# Patient Record
Sex: Female | Born: 1960 | Race: White | Hispanic: No | Smoking: Never smoker
Health system: Southern US, Community
[De-identification: ages and names within clinical notes are randomized; demographics above are authoritative.]

## PROBLEM LIST (undated history)

## (undated) ENCOUNTER — Ambulatory Visit (HOSPITAL_BASED_OUTPATIENT_CLINIC_OR_DEPARTMENT_OTHER): Admission: EM | Source: Home / Self Care

## (undated) DIAGNOSIS — G5603 Carpal tunnel syndrome, bilateral upper limbs: Secondary | ICD-10-CM

## (undated) DIAGNOSIS — F329 Major depressive disorder, single episode, unspecified: Secondary | ICD-10-CM

## (undated) DIAGNOSIS — G4730 Sleep apnea, unspecified: Secondary | ICD-10-CM

## (undated) DIAGNOSIS — I10 Essential (primary) hypertension: Secondary | ICD-10-CM

## (undated) DIAGNOSIS — M1990 Unspecified osteoarthritis, unspecified site: Secondary | ICD-10-CM

## (undated) DIAGNOSIS — G4733 Obstructive sleep apnea (adult) (pediatric): Secondary | ICD-10-CM

## (undated) DIAGNOSIS — N390 Urinary tract infection, site not specified: Secondary | ICD-10-CM

## (undated) DIAGNOSIS — J45909 Unspecified asthma, uncomplicated: Secondary | ICD-10-CM

## (undated) DIAGNOSIS — T7840XA Allergy, unspecified, initial encounter: Secondary | ICD-10-CM

## (undated) DIAGNOSIS — K529 Noninfective gastroenteritis and colitis, unspecified: Secondary | ICD-10-CM

## (undated) DIAGNOSIS — F32A Depression, unspecified: Secondary | ICD-10-CM

## (undated) DIAGNOSIS — M549 Dorsalgia, unspecified: Secondary | ICD-10-CM

## (undated) DIAGNOSIS — N189 Chronic kidney disease, unspecified: Secondary | ICD-10-CM

## (undated) HISTORY — PX: CERVICAL FUSION: SHX112

## (undated) HISTORY — DX: Sleep apnea, unspecified: G47.30

## (undated) HISTORY — DX: Dorsalgia, unspecified: M54.9

## (undated) HISTORY — DX: Noninfective gastroenteritis and colitis, unspecified: K52.9

## (undated) HISTORY — DX: Chronic kidney disease, unspecified: N18.9

## (undated) HISTORY — DX: Unspecified asthma, uncomplicated: J45.909

## (undated) HISTORY — DX: Obstructive sleep apnea (adult) (pediatric): G47.33

## (undated) HISTORY — DX: Allergy, unspecified, initial encounter: T78.40XA

---

## 2003-03-26 HISTORY — PX: CHOLECYSTECTOMY, LAPAROSCOPIC: SHX56

## 2009-02-27 ENCOUNTER — Emergency Department (HOSPITAL_COMMUNITY): Admission: EM | Admit: 2009-02-27 | Discharge: 2009-02-27 | Payer: Self-pay | Admitting: Psychiatry

## 2010-01-16 ENCOUNTER — Emergency Department (HOSPITAL_COMMUNITY): Admission: EM | Admit: 2010-01-16 | Discharge: 2010-01-16 | Payer: Self-pay | Admitting: Emergency Medicine

## 2010-06-06 LAB — POCT I-STAT, CHEM 8
BUN: 14 mg/dL (ref 6–23)
Sodium: 139 mEq/L (ref 135–145)
TCO2: 23 mmol/L (ref 0–100)

## 2010-06-06 LAB — URINALYSIS, ROUTINE W REFLEX MICROSCOPIC
Glucose, UA: NEGATIVE mg/dL
Protein, ur: NEGATIVE mg/dL
Specific Gravity, Urine: 1.024 (ref 1.005–1.030)
pH: 6 (ref 5.0–8.0)

## 2011-09-16 ENCOUNTER — Emergency Department (HOSPITAL_COMMUNITY)
Admission: EM | Admit: 2011-09-16 | Discharge: 2011-09-16 | Disposition: A | Discharge status: Home / Self Care | Payer: Self-pay | Attending: Emergency Medicine | Admitting: Emergency Medicine

## 2011-09-16 ENCOUNTER — Encounter (HOSPITAL_COMMUNITY): Payer: Self-pay | Admitting: Emergency Medicine

## 2011-09-16 DIAGNOSIS — I10 Essential (primary) hypertension: Secondary | ICD-10-CM | POA: Insufficient documentation

## 2011-09-16 DIAGNOSIS — F3289 Other specified depressive episodes: Secondary | ICD-10-CM | POA: Insufficient documentation

## 2011-09-16 DIAGNOSIS — R109 Unspecified abdominal pain: Secondary | ICD-10-CM | POA: Insufficient documentation

## 2011-09-16 DIAGNOSIS — F329 Major depressive disorder, single episode, unspecified: Secondary | ICD-10-CM | POA: Insufficient documentation

## 2011-09-16 HISTORY — DX: Carpal tunnel syndrome, bilateral upper limbs: G56.03

## 2011-09-16 HISTORY — DX: Essential (primary) hypertension: I10

## 2011-09-16 HISTORY — DX: Major depressive disorder, single episode, unspecified: F32.9

## 2011-09-16 HISTORY — DX: Depression, unspecified: F32.A

## 2011-09-16 HISTORY — DX: Urinary tract infection, site not specified: N39.0

## 2011-09-16 LAB — POCT I-STAT, CHEM 8
BUN: 15 mg/dL (ref 6–23)
Chloride: 105 mEq/L (ref 96–112)
Creatinine, Ser: 0.7 mg/dL (ref 0.50–1.10)
Glucose, Bld: 112 mg/dL — ABNORMAL HIGH (ref 70–99)
Potassium: 3.9 mEq/L (ref 3.5–5.1)

## 2011-09-16 LAB — URINALYSIS, ROUTINE W REFLEX MICROSCOPIC
Glucose, UA: NEGATIVE mg/dL
Leukocytes, UA: NEGATIVE
Protein, ur: NEGATIVE mg/dL
Urobilinogen, UA: 0.2 mg/dL (ref 0.0–1.0)

## 2011-09-16 MED ORDER — NAPROXEN 500 MG PO TABS: 500.0000 mg | ORAL_TABLET | Freq: Two times a day (BID) | ORAL | Status: DC

## 2011-09-16 MED ORDER — HYDROCHLOROTHIAZIDE 25 MG PO TABS: 25.0000 mg | ORAL_TABLET | Freq: Every day | ORAL | Status: DC

## 2011-09-16 NOTE — Discharge Instructions (Signed)
Flank Pain Flank pain refers to pain that is located on the side of the body between the upper abdomen and the back. It can be caused by many things. CAUSES  Some of the more common causes of flank pain include:  Muscle strain.   Muscle spasms.   A disease of your spine (vertebral disk disease).   A lung infection (pneumonia).   Fluid around your lungs (pulmonary edema).   A kidney infection.   Kidney stones.   A very painful skin rash on only one side of your body (shingles).   Gallbladder disease.  DIAGNOSIS  Blood tests, urine tests, and X-rays may help your caregiver determine what is wrong. TREATMENT  The treatment of pain depends on the cause. Your caregiver will determine what treatment will work best for you. HOME CARE INSTRUCTIONS   Home care will depend on the cause of your pain.   Some medications may help relieve the pain. Take medication for relief of pain as directed by your caregiver.   Tell your caregiver about any changes in your pain.   Follow up with your caregiver.  SEEK IMMEDIATE MEDICAL CARE IF:   Your pain is not controlled with medication.   The pain increases.   You have abdominal pain.   You have shortness of breath.   You have persistent nausea or vomiting.   You have swelling in your abdomen.   You feel faint or pass out.   You have a temperature by mouth above 102 F (38.9 C), not controlled by medicine.  MAKE SURE YOU:   Understand these instructions.   Will watch your condition.   Will get help right away if you are not doing well or get worse.  Document Released: 05/02/2005 Document Revised: 02/28/2011 Document Reviewed: 08/26/2009 [employer] Patient Information 2012 [employer]. 

## 2011-09-16 NOTE — ED Notes (Signed)
Pt requested for lights to be turned off.

## 2011-09-16 NOTE — ED Notes (Signed)
Pt stated that she has a hx of UTI. The pt took Amoxicillin in past week from friend. Pt stated that pain returned 3 days ago. No fever nausea and vomiting.

## 2011-09-16 NOTE — ED Provider Notes (Signed)
History     CSN: [id number] Arrival date & time 09/16/11  1008 First MD Initiated Contact with Patient 09/16/11 1024      Chief Complaint  Patient presents with  . Flank Pain   HPI The patient states she's been having trouble with flank pain for the last 2 months. She has history of having urinary tract infections in the past and these symptoms feel similar to that.  She has not had any trouble with fever nausea or vomiting. She has not had any abdominal pain. The pain is in both flanks and sometimes palpation can affect the discomfort. Patient states she took amoxicillin that a friend had for 5 days. The symptoms improved somewhat after that but then returned again 3 days ago. Patient doesn't have a primary care Dr. so she came to the emergency room.  Patient also requests a refill on her home medications while she is here today.  Past Medical History  Diagnosis Date  . UTI (lower urinary tract infection)   . Hypertension   . Depression   . Carpal tunnel syndrome, bilateral     Past Surgical History  Procedure Date  . Cesarean section   . Cholecystectomy, laparoscopic 2005    No family history on file.  History  Substance Use Topics  . Smoking status: Never Smoker   . Smokeless tobacco: Not on file  . Alcohol Use: No    OB History    Grav Para Term Preterm Abortions TAB SAB Ect Mult Living                  Review of Systems  Constitutional: Negative for fever.  Respiratory: Negative for cough.   Gastrointestinal: Negative for abdominal pain.  All other systems reviewed and are negative.    Allergies  Macrodantin and Morphine and related  Home Medications  No current outpatient prescriptions on file.  BP 127/88  Pulse 67  Temp 98.2 F (36.8 C) (Oral)  Resp 16  SpO2 99%  LMP 12/17/2010  Physical Exam  Nursing note and vitals reviewed. Constitutional: She appears well-developed and well-nourished. No distress.  HENT:  Head: Normocephalic and  atraumatic.  Right Ear: External ear normal.  Left Ear: External ear normal.  Eyes: Conjunctivae are normal. Right eye exhibits no discharge. Left eye exhibits no discharge. No scleral icterus.  Neck: Neck supple. No tracheal deviation present.  Cardiovascular: Normal rate, regular rhythm and intact distal pulses.   Pulmonary/Chest: Effort normal and breath sounds normal. No stridor. No respiratory distress. She has no wheezes. She has no rales.  Abdominal: Soft. Bowel sounds are normal. She exhibits no distension. There is no tenderness. There is CVA tenderness (bilateral flank pain). There is no rebound and no guarding.  Musculoskeletal: She exhibits no edema and no tenderness.  Neurological: She is alert. She has normal strength. No sensory deficit. Cranial nerve deficit:  no gross defecits noted. She exhibits normal muscle tone. She displays no seizure activity. Coordination normal.  Skin: Skin is warm and dry. No rash noted.  Psychiatric: She has a normal mood and affect.    ED Course  Procedures (including critical care time)  Labs Reviewed  POCT I-STAT, CHEM 8 - Abnormal; Notable for the following:    Glucose, Bld 112 (*)     All other components within normal limits  URINALYSIS, ROUTINE W REFLEX MICROSCOPIC  URINE CULTURE   No results found.   1. Flank pain       MDM  Patient  is concerned that she had a urinary tract infection. Her urine at this time is normal. We'll send off a culture however for further analysis considering her concerns. I have discussed the findings with the patient. Recommend followup with primary care Dr. if the symptoms persist I will prescribe her course of nonsteroidal anti-inflammatory agents. Patient did request a refill of her blood pressure medications and I provided for her to the emergency department the  At this time there does not appear to be any evidence of an acute emergency medical condition and the patient appears stable for discharge  with appropriate outpatient follow up.        Celene Kras, MD 09/16/11 1131

## 2011-09-17 LAB — URINE CULTURE: Colony Count: 2000

## 2011-09-21 ENCOUNTER — Emergency Department (HOSPITAL_COMMUNITY)
Admission: EM | Admit: 2011-09-21 | Discharge: 2011-09-21 | Disposition: A | Discharge status: Home / Self Care | Payer: Self-pay | Attending: Emergency Medicine | Admitting: Emergency Medicine

## 2011-09-21 ENCOUNTER — Emergency Department (HOSPITAL_COMMUNITY): Payer: Self-pay

## 2011-09-21 ENCOUNTER — Encounter (HOSPITAL_COMMUNITY): Payer: Self-pay | Admitting: *Deleted

## 2011-09-21 DIAGNOSIS — R109 Unspecified abdominal pain: Secondary | ICD-10-CM | POA: Insufficient documentation

## 2011-09-21 DIAGNOSIS — F3289 Other specified depressive episodes: Secondary | ICD-10-CM | POA: Insufficient documentation

## 2011-09-21 DIAGNOSIS — I10 Essential (primary) hypertension: Secondary | ICD-10-CM | POA: Insufficient documentation

## 2011-09-21 DIAGNOSIS — N390 Urinary tract infection, site not specified: Secondary | ICD-10-CM

## 2011-09-21 DIAGNOSIS — Z79899 Other long term (current) drug therapy: Secondary | ICD-10-CM | POA: Insufficient documentation

## 2011-09-21 DIAGNOSIS — R300 Dysuria: Secondary | ICD-10-CM | POA: Insufficient documentation

## 2011-09-21 DIAGNOSIS — R51 Headache: Secondary | ICD-10-CM | POA: Insufficient documentation

## 2011-09-21 DIAGNOSIS — F329 Major depressive disorder, single episode, unspecified: Secondary | ICD-10-CM | POA: Insufficient documentation

## 2011-09-21 LAB — URINALYSIS, ROUTINE W REFLEX MICROSCOPIC
Bilirubin Urine: NEGATIVE
Glucose, UA: NEGATIVE mg/dL
Hgb urine dipstick: NEGATIVE
Specific Gravity, Urine: 1.032 — ABNORMAL HIGH (ref 1.005–1.030)
pH: 5.5 (ref 5.0–8.0)

## 2011-09-21 LAB — URINE MICROSCOPIC-ADD ON

## 2011-09-21 MED ORDER — CIPROFLOXACIN HCL 500 MG PO TABS
500.0000 mg | ORAL_TABLET | Freq: Two times a day (BID) | ORAL | Status: DC
  Administered 2011-09-21: 500 mg via ORAL
  Filled 2011-09-21: qty 1

## 2011-09-21 MED ORDER — KETOROLAC TROMETHAMINE 60 MG/2ML IM SOLN
60.0000 mg | Freq: Once | INTRAMUSCULAR | Status: AC
  Administered 2011-09-21: 60 mg via INTRAMUSCULAR
  Filled 2011-09-21: qty 2

## 2011-09-21 MED ORDER — CIPROFLOXACIN HCL 250 MG PO TABS: 250.0000 mg | ORAL_TABLET | Freq: Two times a day (BID) | ORAL | Status: AC

## 2011-09-21 NOTE — ED Notes (Signed)
Pt from home with reports of left flank pain, burning with urination, headache and left groin pain that began 6 weeks ago but has worsened since Monday. Pt reports being treated here on Monday for same with no improvement. Pt endorses taking Amoxicillin 500mg  twice daily that she got from a friend with minimal improvement.

## 2011-09-21 NOTE — ED Provider Notes (Signed)
History     CSN: [id number]  Arrival date & time 09/21/11  1191   First MD Initiated Contact with Patient 09/21/11 1014      Chief Complaint  Patient presents with  . Flank Pain    left  . Groin Pain    left  . Dysuria  . Headache    (Consider location/radiation/quality/duration/timing/severity/associated sxs/prior treatment) HPI Comments: Patient with a history of urinary tract infections presents emergency department with chief complaint of left-sided flank pain associated with urinary frequency and nausea.  Patient states the flank pain is more of an 8 and colicky type feeling that intermittent in nature and with onset x6 weeks.  Patient self diagnosed herself with a urinary tract infection and took amoxicillin 500 mg twice a day x5 days.  She was seen in this emergency department while on her antibiotic and her urine at that time was clean and the culture did not grow anything out.  Patient believes that she still has a urinary tract infection.  She stopped taking her antibiotic last Thursday is still symptomatic.  Patient denies any fevers, night sweats, chills, vomiting, abdominal pain, abnormal dc, SOB, CP. No other complaints at this time.   Patient is a 51 y.o. female presenting with flank pain, groin pain, dysuria, and headaches. The history is provided by the patient.  Flank Pain Pertinent negatives include no abdominal pain, chest pain, chills, congestion, fever, headaches, numbness or weakness.  Groin Pain Pertinent negatives include no abdominal pain, chest pain, chills, congestion, fever, headaches, numbness or weakness.  Dysuria  Associated symptoms include flank pain. Pertinent negatives include no chills, no frequency and no urgency.  Headache  Pertinent negatives include no fever and no shortness of breath.    Past Medical History  Diagnosis Date  . UTI (lower urinary tract infection)   . Hypertension   . Depression   . Carpal tunnel syndrome, bilateral      Past Surgical History  Procedure Date  . Cesarean section   . Cholecystectomy, laparoscopic 2005    History reviewed. No pertinent family history.  History  Substance Use Topics  . Smoking status: Never Smoker   . Smokeless tobacco: Never Used  . Alcohol Use: No    OB History    Grav Para Term Preterm Abortions TAB SAB Ect Mult Living                  Review of Systems  Constitutional: Negative for fever, chills and appetite change.  HENT: Negative for congestion.   Eyes: Negative for visual disturbance.  Respiratory: Negative for shortness of breath.   Cardiovascular: Negative for chest pain and leg swelling.  Gastrointestinal: Negative for abdominal pain.  Genitourinary: Positive for dysuria and flank pain. Negative for urgency and frequency.  Neurological: Negative for dizziness, syncope, weakness, light-headedness, numbness and headaches.  Psychiatric/Behavioral: Negative for confusion.  All other systems reviewed and are negative.    Allergies  Macrodantin; Morphine and related; and Sudafed  Home Medications   Current Outpatient Rx  Name Route Sig Dispense Refill  . ASPIRIN BUFFERED 325 MG PO TABS Oral Take 325 mg by mouth daily.    Marland Kitchen CITALOPRAM HYDROBROMIDE 20 MG PO TABS Oral Take 20 mg by mouth daily.    Marland Kitchen HYDROCHLOROTHIAZIDE 25 MG PO TABS Oral Take 1 tablet (25 mg total) by mouth daily. 30 tablet 1  . ONE-A-DAY WOMENS PO Oral Take 1 tablet by mouth daily.    . OMEGA-3-ACID ETHYL ESTERS 1  G PO CAPS Oral Take 3 g by mouth 2 (two) times daily.    Marland Kitchen ZINC 30 MG PO CAPS Oral Take 1 capsule by mouth daily.      BP 127/79  Pulse 62  Temp 99.5 F (37.5 C) (Oral)  Resp 18  Wt 160 lb (72.576 kg)  SpO2 99%  LMP 12/17/2010  Physical Exam  Constitutional: She is oriented to person, place, and time. She appears well-developed and well-nourished.       NAD, Normal vitals  HENT:  Head: Normocephalic and atraumatic.  Mouth/Throat: Oropharynx is clear and  moist. No oropharyngeal exudate.  Eyes: Conjunctivae and EOM are normal. Pupils are equal, round, and reactive to light. No scleral icterus.  Neck: Normal range of motion. Neck supple. No tracheal deviation present. No thyromegaly present.  Cardiovascular: Normal rate, regular rhythm, normal heart sounds and intact distal pulses.   Pulmonary/Chest: Effort normal and breath sounds normal. No stridor. No respiratory distress. She has no wheezes.  Abdominal: Soft.       Soft nontender abdomen bowel sounds present.    Musculoskeletal: Normal range of motion. She exhibits no edema and no tenderness.  Neurological: She is alert and oriented to person, place, and time. Coordination normal.  Skin: Skin is warm and dry. No rash noted. No erythema. No pallor.  Psychiatric: She has a normal mood and affect. Her behavior is normal.    ED Course  Procedures (including critical care time)  Labs Reviewed  URINALYSIS, ROUTINE W REFLEX MICROSCOPIC - Abnormal; Notable for the following:    APPearance TURBID (*)     Specific Gravity, Urine 1.032 (*)     All other components within normal limits  URINE MICROSCOPIC-ADD ON - Abnormal; Notable for the following:    Squamous Epithelial / LPF FEW (*)     Bacteria, UA FEW (*)     Crystals CA OXALATE CRYSTALS (*)     All other components within normal limits  URINE CULTURE   Ct Abdomen Pelvis Wo Contrast  09/21/2011  *RADIOLOGY REPORT*  Clinical Data: Flank pain  CT ABDOMEN AND PELVIS WITHOUT CONTRAST  Technique:  Multidetector CT imaging of the abdomen and pelvis was performed following the standard protocol without intravenous contrast.  Comparison: None  Findings: Lung bases appear clear.  No pericardial or pleural effusion.  Normal appearance of the liver.  Prior cholecystectomy. No biliary dilatation. The pancreas is within normal limits.  Normal appearance of the spleen.  Both adrenal glands are normal.  The left kidney is normal.  The right kidney is  normal.  No hydronephrosis or nephrolithiasis.  The urinary bladder is within normal limits.  The uterus and adnexal structures are unremarkable.  There is no adenopathy within the upper abdomen.  No enlarged pelvic or inguinal lymph nodes.  There is no free fluid or fluid collections within the abdomen or pelvis.  The duodenum and stomach appear within normal limits.  The small bowel loops are negative.  The appendix is identified and appears normal.  The colon is negative.  No obstructing colonic mass noted.  Mild degenerative disc disease noted within the lower thoracic and upper lumbar spine.  IMPRESSION:  1.  No acute findings identified. 2.  Prior cholecystectomy.  Original Report Authenticated By: Rosealee Albee, M.D.     No diagnosis found.    MDM  Likely UTI  Pt with mucous in UA microscopic. No bacteria or WBC likely d/t previous abx use. Will be treated with  Cipro 250BID x 7 days. Advised to use naprosyn for pain mngt prescribed by previous provider. At this time there does not appear to be any evidence of an acute emergency medical condition and the patient appears stable for discharge with appropriate outpatient follow up.  Jaci Carrel, PA-C 09/21/11 1236

## 2011-09-21 NOTE — ED Provider Notes (Signed)
Medical screening examination/treatment/procedure(s) were performed by non-physician practitioner and as supervising physician I was immediately available for consultation/collaboration.   Gwyneth Sprout, MD 09/21/11 [phone]

## 2011-09-21 NOTE — Discharge Instructions (Signed)
.  Urine Culture Collection, Female  You will collect a sample of pee (urine) in a cup. Read the instructions below before beginning. If you have any questions, ask the nurse before you begin. Follow the instructions carefully. 1. Wash your hands with soap and water and dry them thoroughly.  2. Open the lid of the cup. Be careful not to touch the inside.  3. Clean the private (genital) area. 1. Sit over the toilet. Use the fingers of one hand to separate and hold open the folds of the skin in your private area.  2. Clean the pee (urinary) opening and surrounding area with the gauze, wiping from front to back. Throw away the gauze in the trash, not the toilet.  3. Repeat step "b"2 more times.  4. With the folds of skin still separated, pee a small amount into toilet. STOP, then pee into the cup. Fill the cup half way.  5. Put the lid on the cup tightly.  6. Wash your hands with soap and water.  7. If you were given a label, put the label on the cup.  8. Give the cup to the nurse.  Document Released: 02/22/2008 Document Revised: 02/28/2011 Document Reviewed: 02/22/2008 [facility] Patient Information 2012 [place], [place].

## 2011-09-23 LAB — URINE CULTURE: Special Requests: NORMAL

## 2012-03-10 ENCOUNTER — Ambulatory Visit: Payer: Self-pay | Admitting: Family Medicine

## 2012-03-10 VITALS — BP 123/77 | HR 99 | Temp 99.3°F | Resp 16 | Ht 60.0 in | Wt 159.0 lb

## 2012-03-10 DIAGNOSIS — F329 Major depressive disorder, single episode, unspecified: Secondary | ICD-10-CM

## 2012-03-10 DIAGNOSIS — I10 Essential (primary) hypertension: Secondary | ICD-10-CM

## 2012-03-10 DIAGNOSIS — F3289 Other specified depressive episodes: Secondary | ICD-10-CM

## 2012-03-10 DIAGNOSIS — R350 Frequency of micturition: Secondary | ICD-10-CM

## 2012-03-10 DIAGNOSIS — R109 Unspecified abdominal pain: Secondary | ICD-10-CM

## 2012-03-10 DIAGNOSIS — F32A Depression, unspecified: Secondary | ICD-10-CM

## 2012-03-10 LAB — POCT CBC
MCH, POC: 29.1 pg (ref 27–31.2)
MCHC: 30.9 g/dL — AB (ref 31.8–35.4)
MCV: 94 fL (ref 80–97)
MID (cbc): 0.7 (ref 0–0.9)
MPV: 7.9 fL (ref 0–99.8)
POC LYMPH PERCENT: 28.4 %L (ref 10–50)
POC MID %: 6.6 %M (ref 0–12)
Platelet Count, POC: 303 10*3/uL (ref 142–424)
RBC: 4 M/uL — AB (ref 4.04–5.48)
RDW, POC: 13.4 %
WBC: 10.1 10*3/uL (ref 4.6–10.2)

## 2012-03-10 LAB — BASIC METABOLIC PANEL
BUN: 13 mg/dL (ref 6–23)
Calcium: 9.6 mg/dL (ref 8.4–10.5)
Chloride: 103 mEq/L (ref 96–112)
Creat: 0.75 mg/dL (ref 0.50–1.10)

## 2012-03-10 LAB — POCT URINALYSIS DIPSTICK
Bilirubin, UA: NEGATIVE
Blood, UA: NEGATIVE
Glucose, UA: NEGATIVE
Ketones, UA: NEGATIVE
Leukocytes, UA: NEGATIVE
Nitrite, UA: NEGATIVE
Protein, UA: NEGATIVE
Spec Grav, UA: 1.015
Urobilinogen, UA: 0.2
pH, UA: 7.5

## 2012-03-10 LAB — POCT UA - MICROSCOPIC ONLY
Casts, Ur, LPF, POC: NEGATIVE
Crystals, Ur, HPF, POC: NEGATIVE
Mucus, UA: NEGATIVE
Yeast, UA: NEGATIVE

## 2012-03-10 MED ORDER — CITALOPRAM HYDROBROMIDE 20 MG PO TABS: 20.0000 mg | ORAL_TABLET | Freq: Every day | ORAL | Status: DC

## 2012-03-10 MED ORDER — HYDROCHLOROTHIAZIDE 25 MG PO TABS: 25.0000 mg | ORAL_TABLET | Freq: Every day | ORAL | Status: DC

## 2012-03-10 MED ORDER — CEPHALEXIN 500 MG PO CAPS: 500.0000 mg | ORAL_CAPSULE | Freq: Three times a day (TID) | ORAL | Status: DC

## 2012-03-10 NOTE — Patient Instructions (Addendum)
Drink plenty of water.  I will let you know when you labs come in- we did a urine culture to ensure that you do have an infection and that we are treating you with the correct medication.    If you are running a fever or feeling worse please let me know right away!

## 2012-03-10 NOTE — Progress Notes (Signed)
[facility] [address] [place] [address] [phone]  Date:  03/10/2012   Name:  Kari [name]   DOB:  July 08, 1960   MRN:  [id number]  PCP:  No primary provider on file.    Chief Complaint: Urinary Frequency   History of Present Illness:  Kari [name] is a 51 y.o. very pleasant female patient who presents with the following:  She is here today with some LBP more on the left- she thought she might have a UTI.  She does tend to have UTIs on occasion.  She has had a few UTIs over the last several months. The pain does not radiate to her legs and she does not have any numbness or weakness.     She has noted some chills and has felt tired, but no fever.  She has not noted any particular urinary symptoms.    She notes a mild scratchy throat and mild diarrhea which she attributes to drinking coconut water.   She also uses HCTZ for her BP and celexa for depression- the celexa works well, and she has been on this for several years.she needs a RF today if possible.  She has not had a period in several months and believes she is in menopause.    There is no problem list on file for this patient.   Past Medical History  Diagnosis Date  . UTI (lower urinary tract infection)   . Hypertension   . Depression   . Carpal tunnel syndrome, bilateral     Past Surgical History  Procedure Date  . Cesarean section   . Cholecystectomy, laparoscopic 2005    History  Substance Use Topics  . Smoking status: Never Smoker   . Smokeless tobacco: Never Used  . Alcohol Use: No    No family history on file.  Allergies  Allergen Reactions  . Macrodantin (Nitrofurantoin Macrocrystal) Itching  . Morphine And Related Itching  . Sudafed (Pseudoephedrine Hcl)     Blood pressure goes up    Medication list has been reviewed and updated.  Current Outpatient Prescriptions on File Prior to Visit  Medication Sig Dispense Refill  . citalopram (CELEXA) 20 MG  tablet Take 20 mg by mouth daily.      . hydrochlorothiazide (HYDRODIURIL) 25 MG tablet Take 1 tablet (25 mg total) by mouth daily.  30 tablet  1  . Multiple Vitamins-Calcium (ONE-A-DAY WOMENS PO) Take 1 tablet by mouth daily.      . Zinc 30 MG CAPS Take 1 capsule by mouth daily.      Marland Kitchen aspirin 325 MG buffered tablet Take 325 mg by mouth daily.      Marland Kitchen omega-3 acid ethyl esters (LOVAZA) 1 G capsule Take 3 g by mouth 2 (two) times daily.        Review of Systems:  As per HPI- otherwise negative.   Physical Examination: Filed Vitals:   03/10/12 1241  BP: 123/77  Pulse: 99  Temp: 99.3 F (37.4 C)  Resp: 16   Filed Vitals:   03/10/12 1241  Height: 5' (1.524 m)  Weight: 159 lb (72.122 kg)   Body mass index is 31.05 kg/(m^2). Ideal Body Weight: Weight in (lb) to have BMI = 25: 127.7   GEN: WDWN, NAD, Non-toxic, A & O x 3, overweight HEENT: Atraumatic, Normocephalic. Neck supple. No masses, No LAD. Ears and Nose: No external deformity. CV: RRR, No M/G/R. No JVD. No thrill. No extra heart sounds. PULM: CTA  B, no wheezes, crackles, rhonchi. No retractions. No resp. distress. No accessory muscle use. ABD: S, ND, +BS. No rebound. No HSM. Generalized mild TTP over belly Mild tenderness over bilateral lower back, left more than right.  Negative SLR bilaterally, normal LE strength, sensation and DTR EXTR: No c/c/e NEURO Normal gait.  PSYCH: Normally interactive. Conversant. Not depressed or anxious appearing.  Calm demeanor.   Results for orders placed in visit on 03/10/12  POCT URINALYSIS DIPSTICK      Component Value Range   Color, UA yellow     Clarity, UA clear     Glucose, UA neg     Bilirubin, UA neg     Ketones, UA neg     Spec Grav, UA 1.015     Blood, UA neg     pH, UA 7.5     Protein, UA neg     Urobilinogen, UA 0.2     Nitrite, UA neg     Leukocytes, UA Negative    POCT UA - MICROSCOPIC ONLY      Component Value Range   WBC, Ur, HPF, POC 5-10     RBC, urine,  microscopic 0-1     Bacteria, U Microscopic 1+     Mucus, UA neg     Epithelial cells, urine per micros 2-4     Crystals, Ur, HPF, POC neg     Casts, Ur, LPF, POC neg     Yeast, UA neg    POCT CBC      Component Value Range   WBC 10.1  4.6 - 10.2 K/uL   Lymph, poc 2.9  0.6 - 3.4   POC LYMPH PERCENT 28.4  10 - 50 %L   MID (cbc) 0.7  0 - 0.9   POC MID % 6.6  0 - 12 %M   POC Granulocyte 6.6  2 - 6.9   Granulocyte percent 65.0  37 - 80 %G   RBC 4.00 (*) 4.04 - 5.48 M/uL   Hemoglobin 14.2  12.2 - 16.2 g/dL   HCT, POC 43.5  68.6 - 47.9 %   MCV 94.0  80 - 97 fL   MCH, POC 29.1  27 - 31.2 pg   MCHC 30.9 (*) 31.8 - 35.4 g/dL   RDW, POC 16.8     Platelet Count, POC 303  142 - 424 K/uL   MPV 7.9  0 - 99.8 fL     Assessment and Plan: 1. Urinary frequency  POCT urinalysis dipstick, POCT UA - Microscopic Only, POCT CBC, Basic metabolic panel, Urine culture, cephALEXin (KEFLEX) 500 MG capsule  2. Right flank pain  POCT urinalysis dipstick, POCT UA - Microscopic Only  3. HTN (hypertension)  Basic metabolic panel, hydrochlorothiazide (HYDRODIURIL) 25 MG tablet  4. Depression  citalopram (CELEXA) 20 MG tablet   Refilled her HCTZ and celexa today.  Treat for possible UTI with keflex, await culture.  She will push fluids, let me know if getting worse.  Alerted her that her wbc count is borderline- let me know if she has a fever or other worsening symptoms  Refilled HCTZ- BP controlled Refilled celexa- stable depression  Meds ordered this encounter  Medications  . citalopram (CELEXA) 20 MG tablet    Sig: Take 1 tablet (20 mg total) by mouth daily.    Dispense:  90 tablet    Refill:  2  . DISCONTD: hydrochlorothiazide (HYDRODIURIL) 25 MG tablet    Sig: Take 1 tablet (25 mg total)  by mouth daily.    Dispense:  90 tablet    Refill:  2  . cephALEXin (KEFLEX) 500 MG capsule    Sig: Take 1 capsule (500 mg total) by mouth 3 (three) times daily.    Dispense:  30 capsule    Refill:  0  .  hydrochlorothiazide (HYDRODIURIL) 25 MG tablet    Sig: Take 1 tablet (25 mg total) by mouth daily.    Dispense:  90 tablet    Refill:  2     COPLAND,JESSICA, MD

## 2012-03-12 LAB — URINE CULTURE: Colony Count: 5000

## 2012-08-30 ENCOUNTER — Emergency Department (HOSPITAL_COMMUNITY): Payer: Self-pay

## 2012-08-30 ENCOUNTER — Emergency Department (HOSPITAL_COMMUNITY)
Admission: EM | Admit: 2012-08-30 | Discharge: 2012-08-30 | Disposition: A | Discharge status: Home / Self Care | Payer: Self-pay | Attending: Emergency Medicine | Admitting: Emergency Medicine

## 2012-08-30 ENCOUNTER — Encounter (HOSPITAL_COMMUNITY): Payer: Self-pay

## 2012-08-30 DIAGNOSIS — Z87442 Personal history of urinary calculi: Secondary | ICD-10-CM | POA: Insufficient documentation

## 2012-08-30 DIAGNOSIS — F329 Major depressive disorder, single episode, unspecified: Secondary | ICD-10-CM | POA: Insufficient documentation

## 2012-08-30 DIAGNOSIS — Z8669 Personal history of other diseases of the nervous system and sense organs: Secondary | ICD-10-CM | POA: Insufficient documentation

## 2012-08-30 DIAGNOSIS — I10 Essential (primary) hypertension: Secondary | ICD-10-CM | POA: Insufficient documentation

## 2012-08-30 DIAGNOSIS — Z7982 Long term (current) use of aspirin: Secondary | ICD-10-CM | POA: Insufficient documentation

## 2012-08-30 DIAGNOSIS — Z87440 Personal history of urinary (tract) infections: Secondary | ICD-10-CM | POA: Insufficient documentation

## 2012-08-30 DIAGNOSIS — F3289 Other specified depressive episodes: Secondary | ICD-10-CM | POA: Insufficient documentation

## 2012-08-30 DIAGNOSIS — Z79899 Other long term (current) drug therapy: Secondary | ICD-10-CM | POA: Insufficient documentation

## 2012-08-30 DIAGNOSIS — M549 Dorsalgia, unspecified: Secondary | ICD-10-CM | POA: Insufficient documentation

## 2012-08-30 LAB — URINALYSIS, ROUTINE W REFLEX MICROSCOPIC
Bilirubin Urine: NEGATIVE
Ketones, ur: NEGATIVE mg/dL
Nitrite: NEGATIVE
Protein, ur: NEGATIVE mg/dL
Urobilinogen, UA: 0.2 mg/dL (ref 0.0–1.0)

## 2012-08-30 MED ORDER — OXYCODONE-ACETAMINOPHEN 5-325 MG PO TABS: 1.0000 | ORAL_TABLET | Freq: Once | ORAL | Status: DC

## 2012-08-30 MED ORDER — CYCLOBENZAPRINE HCL 10 MG PO TABS
10.0000 mg | ORAL_TABLET | Freq: Once | ORAL | Status: AC
  Administered 2012-08-30: 10 mg via ORAL
  Filled 2012-08-30: qty 1

## 2012-08-30 MED ORDER — CYCLOBENZAPRINE HCL 10 MG PO TABS: 10.0000 mg | ORAL_TABLET | Freq: Three times a day (TID) | ORAL | Status: DC

## 2012-08-30 MED ORDER — OXYCODONE-ACETAMINOPHEN 5-325 MG PO TABS
1.0000 | ORAL_TABLET | Freq: Once | ORAL | Status: AC
  Administered 2012-08-30: 1 via ORAL
  Filled 2012-08-30: qty 1

## 2012-08-30 NOTE — ED Provider Notes (Signed)
History     CSN: [id number]  Arrival date & time 08/30/12  1356   First MD Initiated Contact with Patient 08/30/12 1417      Chief Complaint  Patient presents with  . Flank Pain    (Consider location/radiation/quality/duration/timing/severity/associated sxs/prior treatment) Patient is a 52 y.o. female presenting with flank pain. The history is provided by the patient and the spouse. No language interpreter was used.  Flank Pain This is a new problem. The current episode started in the past 7 days. The problem occurs constantly. Pertinent negatives include no abdominal pain, chest pain, chills, coughing, fever, headaches, nausea, neck pain or numbness. Associated symptoms comments: Pain in left lateral back x 2 days without known injury. She reports it is worse with movement and better with rest, but never resolves. It is worse with deep breathing but she denies cough or shortness of breath. She has a history of kidney stones but reports that this is not pain familiar to her as kidney stone pain. No fever. .    Past Medical History  Diagnosis Date  . UTI (lower urinary tract infection)   . Hypertension   . Depression   . Carpal tunnel syndrome, bilateral     Past Surgical History  Procedure Laterality Date  . Cesarean section    . Cholecystectomy, laparoscopic  2005    Family History  Problem Relation Age of Onset  . Hypertension Mother   . Heart failure Father   . Hypertension Father   . Kidney Stones Brother     History  Substance Use Topics  . Smoking status: Never Smoker   . Smokeless tobacco: Never Used  . Alcohol Use: No    OB History   Grav Para Term Preterm Abortions TAB SAB Ect Mult Living                  Review of Systems  Constitutional: Negative for fever and chills.  HENT: Negative.  Negative for neck pain.   Respiratory: Negative.  Negative for cough and shortness of breath.   Cardiovascular: Negative.  Negative for chest pain.   Gastrointestinal: Negative.  Negative for nausea and abdominal pain.  Genitourinary: Negative for dysuria and hematuria.  Musculoskeletal: Positive for back pain.  Neurological: Negative.  Negative for numbness and headaches.    Allergies  Macrodantin; Morphine and related; and Sudafed  Home Medications   Current Outpatient Rx  Name  Route  Sig  Dispense  Refill  . aspirin 325 MG buffered tablet   Oral   Take 325-650 mg by mouth 2 (two) times daily as needed for pain.          . citalopram (CELEXA) 20 MG tablet   Oral   Take 1 tablet (20 mg total) by mouth daily.   90 tablet   2   . fexofenadine (ALLEGRA) 180 MG tablet   Oral   Take 180 mg by mouth daily.         . hydrochlorothiazide (HYDRODIURIL) 25 MG tablet   Oral   Take 1 tablet (25 mg total) by mouth daily.   90 tablet   2   . omega-3 acid ethyl esters (LOVAZA) 1 G capsule   Oral   Take 2 g by mouth daily.          Marland Kitchen triamcinolone (NASACORT) 55 MCG/ACT nasal inhaler   Nasal   Place 2 sprays into the nose daily as needed (for allergies).  BP 104/65  Pulse 72  Temp(Src) 99.1 F (37.3 C) (Oral)  Resp 18  Ht 5' (1.524 m)  Wt 167 lb (75.751 kg)  BMI 32.62 kg/m2  SpO2 98%  LMP 12/17/2010  Physical Exam  Constitutional: She appears well-developed and well-nourished.  HENT:  Head: Normocephalic.  Neck: Normal range of motion. Neck supple.  Cardiovascular: Normal rate and regular rhythm.   Pulmonary/Chest: Effort normal and breath sounds normal. She has no wheezes. She has no rales. She exhibits no tenderness.  Abdominal: Soft. Bowel sounds are normal. There is no tenderness. There is no rebound and no guarding.  Genitourinary:  No CVA tenderness.  Musculoskeletal: Normal range of motion.  Tenderness to left para-thoracic back without swelling or discoloration. Palpation reproduces pain of complaint.   Neurological: She is alert. No cranial nerve deficit.  Skin: Skin is warm and dry.  No rash noted.  Psychiatric: She has a normal mood and affect.    ED Course  Procedures (including critical care time)  Labs Reviewed  URINALYSIS, ROUTINE W REFLEX MICROSCOPIC - Abnormal; Notable for the following:    Leukocytes, UA SMALL (*)    All other components within normal limits  URINE MICROSCOPIC-ADD ON   Dg Chest 2 View  08/30/2012   *RADIOLOGY REPORT*  Clinical Data: Bilateral posterior mid back pain, right greater than left the last 2 days.  Treated hypertension.  CHEST - 2 VIEW  Comparison: 02/27/2009  Findings: Heart size is upper limits normal.  The lungs are free of focal consolidations and pleural effusions.  No pulmonary edema. Moderate mid thoracic degenerative changes are present.  IMPRESSION: No evidence for acute cardiopulmonary abnormality.   Original Report Authenticated By: Norva Pavlov, M.D.     No diagnosis found. 1. Muscular back pain   MDM  Suspect muscular pain and/or spasm with negative CXR and exam findings. Stable for discharge.         Arnoldo Hooker, PA-C 08/30/12 1656

## 2012-08-30 NOTE — ED Notes (Signed)
Patient reports that she has had left flank pain and states she passed a stone. Patient also reports that at that time she had hematuria. Patient reports she has had bilateral flank pain and dysuria x 2 days. Patient denies hematuria today.

## 2012-08-30 NOTE — ED Notes (Signed)
Reports taking 81mg  baby aspirin for pain

## 2012-09-02 NOTE — ED Provider Notes (Signed)
Medical screening examination/treatment/procedure(s) were performed by non-physician practitioner and as supervising physician I was immediately available for consultation/collaboration.   Rolan Bucco, MD 09/02/12 1505

## 2012-09-08 ENCOUNTER — Institutional Professional Consult (permissible substitution): Payer: Self-pay | Admitting: Medical

## 2012-11-18 ENCOUNTER — Other Ambulatory Visit: Payer: Self-pay | Admitting: Family Medicine

## 2012-12-25 ENCOUNTER — Emergency Department (INDEPENDENT_AMBULATORY_CARE_PROVIDER_SITE_OTHER): Payer: Self-pay

## 2012-12-25 ENCOUNTER — Emergency Department (INDEPENDENT_AMBULATORY_CARE_PROVIDER_SITE_OTHER)
Admission: EM | Admit: 2012-12-25 | Discharge: 2012-12-25 | Disposition: A | Discharge status: Home / Self Care | Payer: Self-pay | Source: Home / Self Care | Attending: Family Medicine | Admitting: Family Medicine

## 2012-12-25 ENCOUNTER — Encounter (HOSPITAL_COMMUNITY): Payer: Self-pay | Admitting: Emergency Medicine

## 2012-12-25 DIAGNOSIS — M545 Low back pain, unspecified: Secondary | ICD-10-CM

## 2012-12-25 DIAGNOSIS — R5383 Other fatigue: Secondary | ICD-10-CM

## 2012-12-25 DIAGNOSIS — R5381 Other malaise: Secondary | ICD-10-CM

## 2012-12-25 HISTORY — DX: Unspecified osteoarthritis, unspecified site: M19.90

## 2012-12-25 LAB — BASIC METABOLIC PANEL
GFR calc Af Amer: >90 mL/min (ref >90)
GFR calc non Af Amer: >90 mL/min (ref >90)
Glucose, Bld: 99 mg/dL (ref 70–99)
Potassium: 3.8 mEq/L (ref 3.5–5.1)
Sodium: 137 mEq/L (ref 135–145)

## 2012-12-25 LAB — CBC
Hemoglobin: 14.5 g/dL (ref 12.0–15.0)
MCHC: 35.4 g/dL (ref 30.0–36.0)
Platelets: 287 10*3/uL (ref 150–400)
RDW: 12.7 % (ref 11.5–15.5)

## 2012-12-25 LAB — SEDIMENTATION RATE: Sed Rate: 5 mm/hr (ref 0–22)

## 2012-12-25 MED ORDER — MELOXICAM 15 MG PO TABS: 15.0000 mg | ORAL_TABLET | Freq: Every day | ORAL | Status: DC | PRN

## 2012-12-25 NOTE — ED Provider Notes (Signed)
Kari [name] is a 52 y.o. female who presents to Urgent Care today for  1) low back pain. Patient has a positive family history for ankylosing spondylosis. She notes up at 18 months of chronic low back pain worse in the morning without radiation. She additionally notes some back stiffness. The pain is moderate to severe at times and is limiting her ability to perform light cooking and cleaning. She denies any weakness or difficulty walking bowel bladder dysfunction. She denies any injury.  She additionally notes fatigue and bilateral hand pain. She especially notes pain in her DIP joints associated with some swelling. This is worsened over the past year or so. She denies any injury. She notes that she had been diagnosed with sleep apnea in the past but has lost weight and no longer uses her CPAP machine. Her husband notes that she does snore.  As she does have a family history of ankylosing spondylosis she is worried that she may have psoriatic arthritis or ankylosing spondylosis. She would like to be evaluated for this today if possible.   She is unable to work because of pain   Past Medical History  Diagnosis Date  . UTI (lower urinary tract infection)   . Hypertension   . Depression   . Carpal tunnel syndrome, bilateral   . Arthritis   . Depression    History  Substance Use Topics  . Smoking status: Never Smoker   . Smokeless tobacco: Never Used  . Alcohol Use: No   ROS as above Medications reviewed. No current facility-administered medications for this encounter.   Current Outpatient Prescriptions  Medication Sig Dispense Refill  . aspirin 325 MG buffered tablet Take 325-650 mg by mouth 2 (two) times daily as needed for pain.       . citalopram (CELEXA) 20 MG tablet Take 1 tablet (20 mg total) by mouth daily.  90 tablet  2  . cyclobenzaprine (FLEXERIL) 10 MG tablet Take 1 tablet (10 mg total) by mouth 3 (three) times daily.  21 tablet  0  . fexofenadine (ALLEGRA) 180 MG  tablet Take 180 mg by mouth daily.      . hydrochlorothiazide (HYDRODIURIL) 25 MG tablet TAKE ONE TABLET BY MOUTH EVERY DAY  90 tablet  0  . meloxicam (MOBIC) 15 MG tablet Take 1 tablet (15 mg total) by mouth daily as needed for pain.  30 tablet  1  . omega-3 acid ethyl esters (LOVAZA) 1 G capsule Take 2 g by mouth daily.       Marland Kitchen oxyCODONE-acetaminophen (PERCOCET/ROXICET) 5-325 MG per tablet Take 1-2 tablets by mouth once.  15 tablet  0  . triamcinolone (NASACORT) 55 MCG/ACT nasal inhaler Place 2 sprays into the nose daily as needed (for allergies).        Exam:  BP 125/96  Pulse 66  Temp(Src) 97.7 F (36.5 C) (Oral)  Resp 16  SpO2 97%  LMP 12/17/2010 Gen: Well NAD HEENT: EOMI,  MMM Lungs: CTABL Nl WOB Heart: RRR no MRG Abd: NABS, NT, ND Exts: Non edematous BL  LE, warm and well perfused.  Hands: Mildly swollen and somewhat tender DIP joint is worse at right second and third.  No nail pitting.  Back: Nontender to spinal midline. Decreased flexion and extension range of motion Tender palpation left SI joint.  Positive pretzel stretch with decreased mobility.  Negative straight leg raise test bilaterally.  Strength is intact lower extremities. Patient can get on and off exam table.  Skin: No  rash. 3 cm mobile smooth lipoma present in the left low back soft tissue.   Results for orders placed during the hospital encounter of 12/25/12 (from the past 24 hour(s))  CBC     Status: None   Collection Time    12/25/12 11:30 AM      Result Value Range   WBC 9.1  4.0 - 10.5 K/uL   RBC 4.71  3.87 - 5.11 MIL/uL   Hemoglobin 14.5  12.0 - 15.0 g/dL   HCT 78.2  95.6 - 21.3 %   MCV 87.0  78.0 - 100.0 fL   MCH 30.8  26.0 - 34.0 pg   MCHC 35.4  30.0 - 36.0 g/dL   RDW 08.6  57.8 - 46.9 %   Platelets 287  150 - 400 K/uL   Dg Lumbar Spine Complete  12/25/2012   CLINICAL DATA:  Low back pain  EXAM: LUMBAR SPINE - COMPLETE 4+ VIEW  COMPARISON:  CT abdomen and pelvis with bony reformats September 21, 2011  FINDINGS: There are 5 non-rib-bearing lumbar type vertebral bodies. There is no appreciable fracture or spondylolisthesis. There is moderately severe disc space narrowing at T12-L1 and L1-2. There is milder narrowing at L2-3. There is facet osteoarthritic change at L4-5 and L5-S1 bilaterally.  There are anterior osteophytes at T12, L1, and L2. No syndesmophytes are seen.  IMPRESSION: Osteoarthritic change. No syndesmophytic change appreciable. No fracture or spondylolisthesis.   Electronically Signed   By: Bretta Bang M.D.   On: 12/25/2012 11:55   Dg Si Joints  12/25/2012   CLINICAL DATA:  Pain  EXAM: BILATERAL SACROILIAC JOINTS - 3+ VIEW  COMPARISON:  None.  FINDINGS: Frontal and bilateral oblique views were obtained. No fracture or diastases. No demonstrable sacroiliitis. Joint spaces appear intact.  IMPRESSION: No abnormality noted.   Electronically Signed   By: Bretta Bang M.D.   On: 12/25/2012 11:56    Assessment and Plan: 52 y.o. female with back and hand pain associated with fatigue.  X-ray findings are not consistent with ankylosing spondylosis CBC, BMP, ESR, rheumatoid factor are pending Back pain is likely related to DDD Plan to treat with meloxicam and Tylenol  Fatigue is likely related to sleep apnea. Advised patient to resume use of her CPAP machine [facility]up with [facility] Discussed warning signs or symptoms. Please see discharge instructions. Patient expresses understanding.      Rodolph Bong, MD 12/25/12 1229

## 2012-12-25 NOTE — ED Notes (Signed)
Patient has multiple concerns.  Patient reports at least an 18 month history of joint aches and pain, specifically left flank pain.  Most recently has noted fatigue.  Patient reports pain is keeping her from holding down a job and doing things she normally does. No pcp.  Patient looking at Internet for self diagnosing.

## 2012-12-28 NOTE — ED Notes (Signed)
Lab review

## 2012-12-28 NOTE — ED Notes (Signed)
Discussed lab findings w pt after verifying ID

## 2013-01-28 ENCOUNTER — Other Ambulatory Visit: Payer: Self-pay

## 2013-03-01 ENCOUNTER — Emergency Department (HOSPITAL_COMMUNITY): Payer: Self-pay

## 2013-03-01 ENCOUNTER — Emergency Department (HOSPITAL_COMMUNITY)
Admission: EM | Admit: 2013-03-01 | Discharge: 2013-03-01 | Disposition: A | Discharge status: Home / Self Care | Payer: Self-pay | Attending: Emergency Medicine | Admitting: Emergency Medicine

## 2013-03-01 ENCOUNTER — Encounter (HOSPITAL_COMMUNITY): Payer: Self-pay | Admitting: Emergency Medicine

## 2013-03-01 DIAGNOSIS — F3289 Other specified depressive episodes: Secondary | ICD-10-CM | POA: Insufficient documentation

## 2013-03-01 DIAGNOSIS — G5600 Carpal tunnel syndrome, unspecified upper limb: Secondary | ICD-10-CM | POA: Insufficient documentation

## 2013-03-01 DIAGNOSIS — R0789 Other chest pain: Secondary | ICD-10-CM | POA: Insufficient documentation

## 2013-03-01 DIAGNOSIS — J3489 Other specified disorders of nose and nasal sinuses: Secondary | ICD-10-CM | POA: Insufficient documentation

## 2013-03-01 DIAGNOSIS — I10 Essential (primary) hypertension: Secondary | ICD-10-CM | POA: Insufficient documentation

## 2013-03-01 DIAGNOSIS — J209 Acute bronchitis, unspecified: Secondary | ICD-10-CM | POA: Insufficient documentation

## 2013-03-01 DIAGNOSIS — Z79899 Other long term (current) drug therapy: Secondary | ICD-10-CM | POA: Insufficient documentation

## 2013-03-01 DIAGNOSIS — J40 Bronchitis, not specified as acute or chronic: Secondary | ICD-10-CM

## 2013-03-01 DIAGNOSIS — R05 Cough: Secondary | ICD-10-CM | POA: Insufficient documentation

## 2013-03-01 DIAGNOSIS — R059 Cough, unspecified: Secondary | ICD-10-CM | POA: Insufficient documentation

## 2013-03-01 DIAGNOSIS — F329 Major depressive disorder, single episode, unspecified: Secondary | ICD-10-CM | POA: Insufficient documentation

## 2013-03-01 DIAGNOSIS — M129 Arthropathy, unspecified: Secondary | ICD-10-CM | POA: Insufficient documentation

## 2013-03-01 MED ORDER — AZITHROMYCIN 250 MG PO TABS: 250.0000 mg | ORAL_TABLET | Freq: Every day | ORAL | Status: DC

## 2013-03-01 MED ORDER — ALBUTEROL SULFATE HFA 108 (90 BASE) MCG/ACT IN AERS
2.0000 | INHALATION_SPRAY | Freq: Once | RESPIRATORY_TRACT | Status: AC
  Administered 2013-03-01: 2 via RESPIRATORY_TRACT
  Filled 2013-03-01: qty 6.7

## 2013-03-01 MED ORDER — DEXAMETHASONE SODIUM PHOSPHATE 10 MG/ML IJ SOLN
10.0000 mg | Freq: Once | INTRAMUSCULAR | Status: AC
  Administered 2013-03-01: 10 mg via INTRAMUSCULAR
  Filled 2013-03-01: qty 1

## 2013-03-01 MED ORDER — HYDROCHLOROTHIAZIDE 25 MG PO TABS: 25.0000 mg | ORAL_TABLET | Freq: Two times a day (BID) | ORAL | Status: DC

## 2013-03-01 NOTE — ED Provider Notes (Signed)
CSN: [id number]     Arrival date & time 03/01/13  1053 History  This chart was scribed for Cherrie Distance, PA working with Derwood Kaplan, MD by Quintella Reichert, ED Scribe. This patient was seen in room WTR7/WTR7 and the patient's care was started at 1:34 PM.   Chief Complaint  Patient presents with  . Shortness of Breath    The history is provided by the patient. No language interpreter was used.    HPI Comments: Kari [name] is a 52 y.o. female who presents to the Emergency Department complaining of one week of persistent, progressively-worsening URi symptoms including cough, congestion, and SOB.  Pt states that 8 days ago she developed nasal congestion described as "my nose totally closed up" and "the whole day I felt like I was going to sneeze."  That day she also began sneezing and blowing her nose productive of a large amount of clear mucus.  Yesterday she awoke feeling very fatigued and she has developed chest heaviness, SOB, barking cough, sinus pressure, and itching in ears and throat.  Her cough is now productive of "tan" sputum.  She has also had some wheezing over the past week, but none today.  She states her chest heaviness "doesn't feel cardiac at all."  She has been taking her sister's Keflex because she thought she had a sinus infection, without relief.     Past Medical History  Diagnosis Date  . UTI (lower urinary tract infection)   . Hypertension   . Depression   . Carpal tunnel syndrome, bilateral   . Arthritis   . Depression     Past Surgical History  Procedure Laterality Date  . Cesarean section    . Cholecystectomy, laparoscopic  2005    Family History  Problem Relation Age of Onset  . Hypertension Mother   . Heart failure Father   . Hypertension Father   . Kidney Stones Brother     History  Substance Use Topics  . Smoking status: Never Smoker   . Smokeless tobacco: Never Used  . Alcohol Use: No    OB History   Grav Para Term Preterm  Abortions TAB SAB Ect Mult Living                  Review of Systems  All other systems reviewed and are negative.     Allergies  Macrodantin and Morphine and related  Home Medications   Current Outpatient Rx  Name  Route  Sig  Dispense  Refill  . citalopram (CELEXA) 20 MG tablet   Oral   Take 1 tablet (20 mg total) by mouth daily.   90 tablet   2   . DM-Doxylamine-Acetaminophen (NYQUIL COLD & FLU PO)   Oral   Take 2 capsules by mouth at bedtime.         . fexofenadine (ALLEGRA) 180 MG tablet   Oral   Take 180 mg by mouth daily.         . hydrochlorothiazide (HYDRODIURIL) 25 MG tablet      TAKE ONE TABLET BY MOUTH EVERY DAY   90 tablet   0   . naproxen sodium (ANAPROX) 220 MG tablet   Oral   Take 440 mg by mouth 2 (two) times daily with a meal.          BP 134/93  Pulse 82  Temp(Src) 98 F (36.7 C) (Oral)  Resp 20  Ht 5' (1.524 m)  Wt 160 lb (72.576 kg)  BMI 31.25 kg/m2  SpO2 96%  LMP 12/17/2010  Physical Exam  Nursing note and vitals reviewed. Constitutional: She is oriented to person, place, and time. She appears well-developed and well-nourished. No distress.  HENT:  Head: Normocephalic and atraumatic.  Right Ear: External ear normal.  Left Ear: External ear normal.  Mouth/Throat: Oropharynx is clear and moist. No oropharyngeal exudate.  Boggy turbinates  Eyes: Conjunctivae are normal. Pupils are equal, round, and reactive to light. No scleral icterus.  Neck: Normal range of motion. Neck supple. No spinous process tenderness and no muscular tenderness present. No Brudzinski's sign and no Kernig's sign noted.  Cardiovascular: Normal rate, regular rhythm and normal heart sounds.  Exam reveals no gallop and no friction rub.   No murmur heard. Pulmonary/Chest: Effort normal and breath sounds normal. No respiratory distress. She has no wheezes. She has no rales. She exhibits no tenderness.  Abdominal: Soft. Bowel sounds are normal. She exhibits  no distension. There is no tenderness. There is no rebound and no guarding.  Musculoskeletal: Normal range of motion. She exhibits no edema and no tenderness.  Lymphadenopathy:    She has no cervical adenopathy.  Neurological: She is alert and oriented to person, place, and time. She exhibits normal muscle tone. Coordination normal.  Skin: Skin is warm and dry. No rash noted. No erythema. No pallor.  Psychiatric: She has a normal mood and affect. Her behavior is normal. Judgment and thought content normal.    ED Course  Procedures (including critical care time)  DIAGNOSTIC STUDIES: Oxygen Saturation is 96% on room air, normal by my interpretation.    COORDINATION OF CARE: 1:42 PM-Discussed treatment plan which includes Decadron injection, albuterol treatment, EKG and CXR with pt at bedside and pt agreed to plan.    Labs Review Labs Reviewed - No data to display   Imaging Review Dg Chest 2 View  03/01/2013   CLINICAL DATA:  Shortness of breath and cough  EXAM: CHEST  2 VIEW  COMPARISON:  August 30, 2012  FINDINGS: Lungs are clear. Heart size and pulmonary vascularity are normal. No adenopathy. No bone lesions. There is degenerative change in the thoracic spine.  IMPRESSION: No edema or consolidation.   Electronically Signed   By: Bretta Bang M.D.   On: 03/01/2013 14:12    EKG Interpretation    Date/Time:  Monday March 01 2013 14:47:50 EST Ventricular Rate:  69 PR Interval:  184 QRS Duration: 76 QT Interval:  410 QTC Calculation: 439 R Axis:   43 Text Interpretation:  Normal sinus rhythm Low voltage QRS Borderline ECG Confirmed by Rhunette Croft, MD, ANKIT ([id number]) on 03/01/2013 2:12:50 PM           Results for orders placed during the hospital encounter of 12/25/12  SEDIMENTATION RATE      Result Value Range   Sed Rate 5  0 - 22 mm/hr  CBC      Result Value Range   WBC 9.1  4.0 - 10.5 K/uL   RBC 4.71  3.87 - 5.11 MIL/uL   Hemoglobin 14.5  12.0 - 15.0 g/dL   HCT 16.1   09.6 - 04.5 %   MCV 87.0  78.0 - 100.0 fL   MCH 30.8  26.0 - 34.0 pg   MCHC 35.4  30.0 - 36.0 g/dL   RDW 40.9  81.1 - 91.4 %   Platelets 287  150 - 400 K/uL  BASIC METABOLIC PANEL      Result Value Range  Sodium 137  135 - 145 mEq/L   Potassium 3.8  3.5 - 5.1 mEq/L   Chloride 103  96 - 112 mEq/L   CO2 23  19 - 32 mEq/L   Glucose, Bld 99  70 - 99 mg/dL   BUN 11  6 - 23 mg/dL   Creatinine, Ser 8.29  0.50 - 1.10 mg/dL   Calcium 9.5  8.4 - 56.2 mg/dL   GFR calc non Af Amer >90  >90 mL/min   GFR calc Af Amer >90  >90 mL/min   Dg Chest 2 View  03/01/2013   CLINICAL DATA:  Shortness of breath and cough  EXAM: CHEST  2 VIEW  COMPARISON:  August 30, 2012  FINDINGS: Lungs are clear. Heart size and pulmonary vascularity are normal. No adenopathy. No bone lesions. There is degenerative change in the thoracic spine.  IMPRESSION: No edema or consolidation.   Electronically Signed   By: Bretta Bang M.D.   On: 03/01/2013 14:12      MDM  Bronchitis  Patient with history of URI x 1 week and facial pain, already on keflex for this presents with chest tightness and cough, this is likely bronchitis, will place on zithromax, inhaler (given here) and decadron 10 mg IM for the tightness.  PERC negative, EKG normal, not likely PE or cardiac in nature based on this and clinical examination.  I personally performed the services described in this documentation, which was scribed in my presence. The recorded information has been reviewed and is accurate.    Izola Price Marisue Humble, PA-C 03/01/13 1455

## 2013-03-01 NOTE — Progress Notes (Signed)
[facility] provided pt with a [employer], list of primary care resources, and information on [facility].

## 2013-03-01 NOTE — ED Notes (Addendum)
Patient c/o SOB and nasal congestion x 1 week.  Patient c/o itching in her ears and throat. Patient states she self medicated with her sister's antibiotic/cephalexin 500 mg. Patient also states she took naproxyn 500mg  and ?Tylenol 650 mg at 0800 today.

## 2013-03-02 NOTE — ED Provider Notes (Signed)
Medical screening examination/treatment/procedure(s) were performed by non-physician practitioner and as supervising physician I was immediately available for consultation/collaboration.  EKG Interpretation    Date/Time:  Monday March 01 2013 14:47:50 EST Ventricular Rate:  69 PR Interval:  184 QRS Duration: 76 QT Interval:  410 QTC Calculation: 439 R Axis:   43 Text Interpretation:  Normal sinus rhythm Low voltage QRS Borderline ECG Confirmed by Rhunette Croft, MD, Ryelee Albee ([id number]) on 03/01/2013 2:12:50 PM             Derwood Kaplan, MD 03/02/13 [id number]

## 2013-07-12 ENCOUNTER — Emergency Department (HOSPITAL_BASED_OUTPATIENT_CLINIC_OR_DEPARTMENT_OTHER): Payer: Self-pay

## 2013-07-12 ENCOUNTER — Encounter (HOSPITAL_BASED_OUTPATIENT_CLINIC_OR_DEPARTMENT_OTHER): Payer: Self-pay | Admitting: Emergency Medicine

## 2013-07-12 ENCOUNTER — Emergency Department (HOSPITAL_BASED_OUTPATIENT_CLINIC_OR_DEPARTMENT_OTHER)
Admission: EM | Admit: 2013-07-12 | Discharge: 2013-07-12 | Disposition: A | Discharge status: Home / Self Care | Payer: Self-pay | Attending: Emergency Medicine | Admitting: Emergency Medicine

## 2013-07-12 DIAGNOSIS — Z87440 Personal history of urinary (tract) infections: Secondary | ICD-10-CM | POA: Insufficient documentation

## 2013-07-12 DIAGNOSIS — F329 Major depressive disorder, single episode, unspecified: Secondary | ICD-10-CM | POA: Insufficient documentation

## 2013-07-12 DIAGNOSIS — R079 Chest pain, unspecified: Secondary | ICD-10-CM | POA: Insufficient documentation

## 2013-07-12 DIAGNOSIS — E663 Overweight: Secondary | ICD-10-CM | POA: Insufficient documentation

## 2013-07-12 DIAGNOSIS — Z79899 Other long term (current) drug therapy: Secondary | ICD-10-CM | POA: Insufficient documentation

## 2013-07-12 DIAGNOSIS — Z8669 Personal history of other diseases of the nervous system and sense organs: Secondary | ICD-10-CM | POA: Insufficient documentation

## 2013-07-12 DIAGNOSIS — M129 Arthropathy, unspecified: Secondary | ICD-10-CM | POA: Insufficient documentation

## 2013-07-12 DIAGNOSIS — R110 Nausea: Secondary | ICD-10-CM | POA: Insufficient documentation

## 2013-07-12 DIAGNOSIS — Z791 Long term (current) use of non-steroidal anti-inflammatories (NSAID): Secondary | ICD-10-CM | POA: Insufficient documentation

## 2013-07-12 DIAGNOSIS — R638 Other symptoms and signs concerning food and fluid intake: Secondary | ICD-10-CM | POA: Insufficient documentation

## 2013-07-12 DIAGNOSIS — I10 Essential (primary) hypertension: Secondary | ICD-10-CM | POA: Insufficient documentation

## 2013-07-12 DIAGNOSIS — R1012 Left upper quadrant pain: Secondary | ICD-10-CM | POA: Insufficient documentation

## 2013-07-12 DIAGNOSIS — L259 Unspecified contact dermatitis, unspecified cause: Secondary | ICD-10-CM | POA: Insufficient documentation

## 2013-07-12 DIAGNOSIS — R109 Unspecified abdominal pain: Secondary | ICD-10-CM

## 2013-07-12 DIAGNOSIS — F3289 Other specified depressive episodes: Secondary | ICD-10-CM | POA: Insufficient documentation

## 2013-07-12 DIAGNOSIS — R1013 Epigastric pain: Secondary | ICD-10-CM | POA: Insufficient documentation

## 2013-07-12 DIAGNOSIS — Z9089 Acquired absence of other organs: Secondary | ICD-10-CM | POA: Insufficient documentation

## 2013-07-12 LAB — URINALYSIS, ROUTINE W REFLEX MICROSCOPIC
BILIRUBIN URINE: NEGATIVE
GLUCOSE, UA: NEGATIVE mg/dL
HGB URINE DIPSTICK: NEGATIVE
KETONES UR: NEGATIVE mg/dL
Leukocytes, UA: NEGATIVE
Nitrite: NEGATIVE
PH: 5.5 (ref 5.0–8.0)
PROTEIN: NEGATIVE mg/dL
Specific Gravity, Urine: 1.007 (ref 1.005–1.030)
Urobilinogen, UA: 0.2 mg/dL (ref 0.0–1.0)

## 2013-07-12 LAB — CBC WITH DIFFERENTIAL/PLATELET
BASOS PCT: 0 % (ref 0–1)
Basophils Absolute: 0 10*3/uL (ref 0.0–0.1)
EOS PCT: 3 % (ref 0–5)
Eosinophils Absolute: 0.3 10*3/uL (ref 0.0–0.7)
HEMATOCRIT: 38.2 % (ref 36.0–46.0)
HEMOGLOBIN: 13.2 g/dL (ref 12.0–15.0)
Lymphocytes Relative: 34 % (ref 12–46)
Lymphs Abs: 3.5 10*3/uL (ref 0.7–4.0)
MCH: 30.6 pg (ref 26.0–34.0)
MCHC: 34.6 g/dL (ref 30.0–36.0)
MCV: 88.6 fL (ref 78.0–100.0)
MONO ABS: 0.9 10*3/uL (ref 0.1–1.0)
MONOS PCT: 8 % (ref 3–12)
NEUTROS ABS: 5.7 10*3/uL (ref 1.7–7.7)
Neutrophils Relative %: 55 % (ref 43–77)
Platelets: 310 10*3/uL (ref 150–400)
RBC: 4.31 MIL/uL (ref 3.87–5.11)
RDW: 12.9 % (ref 11.5–15.5)
WBC: 10.5 10*3/uL (ref 4.0–10.5)

## 2013-07-12 LAB — COMPREHENSIVE METABOLIC PANEL
ALBUMIN: 4 g/dL (ref 3.5–5.2)
ALT: 40 U/L — ABNORMAL HIGH (ref 0–35)
AST: 24 U/L (ref 0–37)
Alkaline Phosphatase: 69 U/L (ref 39–117)
BILIRUBIN TOTAL: 0.3 mg/dL (ref 0.3–1.2)
BUN: 16 mg/dL (ref 6–23)
CALCIUM: 9.8 mg/dL (ref 8.4–10.5)
CHLORIDE: 105 meq/L (ref 96–112)
CO2: 21 mEq/L (ref 19–32)
CREATININE: 0.7 mg/dL (ref 0.50–1.10)
GFR calc Af Amer: >90 mL/min (ref >90)
Glucose, Bld: 95 mg/dL (ref 70–99)
Potassium: 4 mEq/L (ref 3.7–5.3)
Sodium: 140 mEq/L (ref 137–147)
Total Protein: 7.3 g/dL (ref 6.0–8.3)

## 2013-07-12 LAB — D-DIMER, QUANTITATIVE (NOT AT ARMC): D DIMER QUANT: 0.39 ug{FEU}/mL (ref 0.00–0.48)

## 2013-07-12 LAB — LIPASE, BLOOD: LIPASE: 43 U/L (ref 11–59)

## 2013-07-12 MED ORDER — HYDROCHLOROTHIAZIDE 25 MG PO TABS: 25.0000 mg | ORAL_TABLET | Freq: Every day | ORAL | Status: DC

## 2013-07-12 MED ORDER — IOHEXOL 300 MG/ML  SOLN
50.0000 mL | Freq: Once | INTRAMUSCULAR | Status: AC | PRN
  Administered 2013-07-12: 50 mL via ORAL

## 2013-07-12 MED ORDER — IOHEXOL 300 MG/ML  SOLN
100.0000 mL | Freq: Once | INTRAMUSCULAR | Status: AC | PRN
  Administered 2013-07-12: 100 mL via INTRAVENOUS

## 2013-07-12 NOTE — ED Notes (Signed)
Pt c/o left lower abd pain x 12 hrs 

## 2013-07-12 NOTE — Discharge Instructions (Signed)
Abdominal Pain, Women °Abdominal (stomach, pelvic, or belly) pain can be caused by many things. It is important to tell your doctor: °· The location of the pain. °· Does it come and go or is it present all the time? °· Are there things that start the pain (eating certain foods, exercise)? °· Are there other symptoms associated with the pain (fever, nausea, vomiting, diarrhea)? °All of this is helpful to know when trying to find the cause of the pain. °CAUSES  °· Stomach: virus or bacteria infection, or ulcer. °· Intestine: appendicitis (inflamed appendix), regional ileitis (Crohn's disease), ulcerative colitis (inflamed colon), irritable bowel syndrome, diverticulitis (inflamed diverticulum of the colon), or cancer of the stomach or intestine. °· Gallbladder disease or stones in the gallbladder. °· Kidney disease, kidney stones, or infection. °· Pancreas infection or cancer. °· Fibromyalgia (pain disorder). °· Diseases of the female organs: °· Uterus: fibroid (non-cancerous) tumors or infection. °· Fallopian tubes: infection or tubal pregnancy. °· Ovary: cysts or tumors. °· Pelvic adhesions (scar tissue). °· Endometriosis (uterus lining tissue growing in the pelvis and on the pelvic organs). °· Pelvic congestion syndrome (female organs filling up with blood just before the menstrual period). °· Pain with the menstrual period. °· Pain with ovulation (producing an egg). °· Pain with an IUD (intrauterine device, birth control) in the uterus. °· Cancer of the female organs. °· Functional pain (pain not caused by a disease, may improve without treatment). °· Psychological pain. °· Depression. °DIAGNOSIS  °Your doctor will decide the seriousness of your pain by doing an examination. °· Blood tests. °· X-rays. °· Ultrasound. °· CT scan (computed tomography, special type of X-ray). °· MRI (magnetic resonance imaging). °· Cultures, for infection. °· Barium enema (dye inserted in the large intestine, to better view it with  X-rays). °· Colonoscopy (looking in intestine with a lighted tube). °· Laparoscopy (minor surgery, looking in abdomen with a lighted tube). °· Major abdominal exploratory surgery (looking in abdomen with a large incision). °TREATMENT  °The treatment will depend on the cause of the pain.  °· Many cases can be observed and treated at home. °· Over-the-counter medicines recommended by your caregiver. °· Prescription medicine. °· Antibiotics, for infection. °· Birth control pills, for painful periods or for ovulation pain. °· Hormone treatment, for endometriosis. °· Nerve blocking injections. °· Physical therapy. °· Antidepressants. °· Counseling with a psychologist or psychiatrist. °· Minor or major surgery. °HOME CARE INSTRUCTIONS  °· Do not take laxatives, unless directed by your caregiver. °· Take over-the-counter pain medicine only if ordered by your caregiver. Do not take aspirin because it can cause an upset stomach or bleeding. °· Try a clear liquid diet (broth or water) as ordered by your caregiver. Slowly move to a bland diet, as tolerated, if the pain is related to the stomach or intestine. °· Have a thermometer and take your temperature several times a day, and record it. °· Bed rest and sleep, if it helps the pain. °· Avoid sexual intercourse, if it causes pain. °· Avoid stressful situations. °· Keep your follow-up appointments and tests, as your caregiver orders. °· If the pain does not go away with medicine or surgery, you may try: °· Acupuncture. °· Relaxation exercises (yoga, meditation). °· Group therapy. °· Counseling. °SEEK MEDICAL CARE IF:  °· You notice certain foods cause stomach pain. °· Your home care treatment is not helping your pain. °· You need stronger pain medicine. °· You want your IUD removed. °· You feel faint or   lightheaded. °· You develop nausea and vomiting. °· You develop a rash. °· You are having side effects or an allergy to your medicine. °SEEK IMMEDIATE MEDICAL CARE IF:  °· Your  pain does not go away or gets worse. °· You have a fever. °· Your pain is felt only in portions of the abdomen. The right side could possibly be appendicitis. The left lower portion of the abdomen could be colitis or diverticulitis. °· You are passing blood in your stools (bright red or black tarry stools, with or without vomiting). °· You have blood in your urine. °· You develop chills, with or without a fever. °· You pass out. °MAKE SURE YOU:  °· Understand these instructions. °· Will watch your condition. °· Will get help right away if you are not doing well or get worse. °Document Released: 01/06/2007 Document Revised: 06/03/2011 Document Reviewed: 01/26/2009 °Exit[employer]® Patient Information ©2014 [employer]. ° °

## 2013-07-12 NOTE — ED Provider Notes (Signed)
CSN: [id number]     Arrival date & time 07/12/13  1733 History  This chart was scribed for Merryl Hacker, MD by Celesta Gentile, ED Scribe. The patient was seen in room MH12/MH12. Patient's care was started at 5:58 PM.  Chief Complaint  Patient presents with  . Abdominal Pain   The history is provided by the patient. No language interpreter was used.   HPI Comments: Allysson Rinehimer is a 53 y.o. female who presents to the Emergency Department complaining of abdominal pain.  Pt states the dull abdominal pain has been present for about 18 months, but the pain worsened this morning.  Pt states the pain radiates to her left side and back.  Pt has associated nausea with a decreased appetite.  She denies the pain worsening with food.  Rates pain at 5/10. She states the pain is worse at night.  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  She states she is treated for a UTI every time, even if the urine doesn't indicate one.  She denies chest pain and SOB.  Pt states if she presses under her left rib cage the pain subsides.  Pt reports she was diagnosed with arthritis in her 63s, but it has rapidly worsened over the years.  Pt also states she has eczema on her hands and feet bilaterally.  Pt reports she takes supplements daily.    Past Medical History  Diagnosis Date  . UTI (lower urinary tract infection)   . Hypertension   . Depression   . Carpal tunnel syndrome, bilateral   . Arthritis   . Depression    Past Surgical History  Procedure Laterality Date  . Cesarean section    . Cholecystectomy, laparoscopic  2005   Family History  Problem Relation Age of Onset  . Hypertension Mother   . Heart failure Father   . Hypertension Father   . Kidney Stones Brother    History  Substance Use Topics  . Smoking status: Never Smoker   . Smokeless tobacco: Never Used  . Alcohol Use: No   OB History   Grav Para Term Preterm Abortions TAB SAB Ect Mult Living                  Review of Systems  Constitutional: Negative for fever.  Respiratory: Negative for cough, chest tightness and shortness of breath.   Cardiovascular: Negative for chest pain.  Gastrointestinal: Positive for abdominal pain. Negative for nausea and vomiting.  Genitourinary: Negative for dysuria.  Musculoskeletal: Negative for back pain.  Skin: Negative for rash.  Neurological: Negative for headaches.  All other systems reviewed and are negative.     Allergies  Macrodantin and Morphine and related  Home Medications   Prior to Admission medications   Medication Sig Start Date End Date Taking? Authorizing Provider  citalopram (CELEXA) 20 MG tablet Take 1 tablet (20 mg total) by mouth daily. 03/10/12   Darreld Mclean, MD  DM-Doxylamine-Acetaminophen (NYQUIL COLD & FLU PO) Take 2 capsules by mouth at bedtime.    Historical Provider, MD  fexofenadine (ALLEGRA) 180 MG tablet Take 180 mg by mouth daily.    Historical Provider, MD  hydrochlorothiazide (HYDRODIURIL) 25 MG tablet TAKE ONE TABLET BY MOUTH EVERY DAY 11/18/12   Rise Mu, PA-C  hydrochlorothiazide (HYDRODIURIL) 25 MG tablet Take 1 tablet (25 mg total) by mouth 2 (two) times daily. 03/01/13   Idalia Needle. Sanford, PA-C  naproxen  sodium (ANAPROX) 220 MG tablet Take 440 mg by mouth 2 (two) times daily with a meal.    Historical Provider, MD   Triage Vitals: BP 132/96  Temp(Src) 99 F (37.2 C) (Oral)  Resp 16  Ht 5' (1.524 m)  Wt 165 lb (74.844 kg)  BMI 32.22 kg/m2  SpO2 100%  LMP 12/17/2010  Physical Exam  Nursing note and vitals reviewed. Constitutional: She is oriented to person, place, and time. She appears well-developed and well-nourished. No distress.  overweight  HENT:  Head: Normocephalic and atraumatic.  Eyes: Pupils are equal, round, and reactive to light.  Neck: Neck supple.  Cardiovascular: Normal rate, regular rhythm and normal heart sounds.   No murmur heard. Pulmonary/Chest: Effort  normal and breath sounds normal. No respiratory distress. She has no wheezes. She exhibits no tenderness.  Abdominal: Soft. Bowel sounds are normal. There is tenderness. There is no rebound and no guarding.  Epigastric tenderness to palpation and left upper quadrant tenderness under the left rib, no rebound or guarding  Musculoskeletal: She exhibits no edema.  Neurological: She is alert and oriented to person, place, and time.  Skin: Skin is warm and dry.  Psychiatric: She has a normal mood and affect.    ED Course  Procedures (including critical care time) DIAGNOSTIC STUDIES: Oxygen Saturation is 100% on RA, normal by my interpretation.    COORDINATION OF CARE: 6:25 PM-Will order chest x-ray, CBC, comprehensive metabolic panel, D-dimer, and UA.  Patient informed of current plan of treatment and evaluation and agrees with plan.    Results for orders placed during the hospital encounter of 07/12/13  URINALYSIS, ROUTINE W REFLEX MICROSCOPIC      Result Value Ref Range   Color, Urine YELLOW  YELLOW   APPearance CLOUDY (*) CLEAR   Specific Gravity, Urine 1.007  1.005 - 1.030   pH 5.5  5.0 - 8.0   Glucose, UA NEGATIVE  NEGATIVE mg/dL   Hgb urine dipstick NEGATIVE  NEGATIVE   Bilirubin Urine NEGATIVE  NEGATIVE   Ketones, ur NEGATIVE  NEGATIVE mg/dL   Protein, ur NEGATIVE  NEGATIVE mg/dL   Urobilinogen, UA 0.2  0.0 - 1.0 mg/dL   Nitrite NEGATIVE  NEGATIVE   Leukocytes, UA NEGATIVE  NEGATIVE  CBC WITH DIFFERENTIAL      Result Value Ref Range   WBC 10.5  4.0 - 10.5 K/uL   RBC 4.31  3.87 - 5.11 MIL/uL   Hemoglobin 13.2  12.0 - 15.0 g/dL   HCT 38.2  36.0 - 46.0 %   MCV 88.6  78.0 - 100.0 fL   MCH 30.6  26.0 - 34.0 pg   MCHC 34.6  30.0 - 36.0 g/dL   RDW 12.9  11.5 - 15.5 %   Platelets 310  150 - 400 K/uL   Neutrophils Relative % 55  43 - 77 %   Neutro Abs 5.7  1.7 - 7.7 K/uL   Lymphocytes Relative 34  12 - 46 %   Lymphs Abs 3.5  0.7 - 4.0 K/uL   Monocytes Relative 8  3 - 12 %    Monocytes Absolute 0.9  0.1 - 1.0 K/uL   Eosinophils Relative 3  0 - 5 %   Eosinophils Absolute 0.3  0.0 - 0.7 K/uL   Basophils Relative 0  0 - 1 %   Basophils Absolute 0.0  0.0 - 0.1 K/uL  COMPREHENSIVE METABOLIC PANEL      Result Value Ref Range   Sodium 140  137 - 147 mEq/L   Potassium 4.0  3.7 - 5.3 mEq/L   Chloride 105  96 - 112 mEq/L   CO2 21  19 - 32 mEq/L   Glucose, Bld 95  70 - 99 mg/dL   BUN 16  6 - 23 mg/dL   Creatinine, Ser 0.70  0.50 - 1.10 mg/dL   Calcium 9.8  8.4 - 10.5 mg/dL   Total Protein 7.3  6.0 - 8.3 g/dL   Albumin 4.0  3.5 - 5.2 g/dL   AST 24  0 - 37 U/L   ALT 40 (*) 0 - 35 U/L   Alkaline Phosphatase 69  39 - 117 U/L   Total Bilirubin 0.3  0.3 - 1.2 mg/dL   GFR calc non Af Amer >90  >90 mL/min   GFR calc Af Amer >90  >90 mL/min  LIPASE, BLOOD      Result Value Ref Range   Lipase 43  11 - 59 U/L  D-DIMER, QUANTITATIVE      Result Value Ref Range   D-Dimer, Quant 0.39  0.00 - 0.48 ug/mL-FEU    Imaging Review Dg Chest 2 View  07/12/2013   CLINICAL DATA:  Left flank pain  EXAM: CHEST  2 VIEW  COMPARISON:  04/15/2013  FINDINGS: Cardiomediastinal silhouette is stable. No acute infiltrate or pleural effusion. No pulmonary edema. Mild degenerative changes thoracic spine.  IMPRESSION: No active cardiopulmonary disease.   Electronically Signed   By: Lahoma Crocker M.D.   On: 07/12/2013 18:30   CT Abdomen and Pelvis with contrast.  07/12/2013 Clinical Data: Abdominal pain, nausea, status postcholecystectomy.  Exam: CT ABDOMEN AND PELVIS WITH CONTRAST Findings: Sagittal images of the spine shows degenerative changes thoracic spine and upper lumbar spine.  Mild hepatic fatty infiltration. Lung bases are unremarkable. The patient is status postcholecystectomy. The pancreas, spleen and adrenal glands are unremarkable.  Kidneys are symmetrical in size and enhancement. No hydronephrosis or hydroureter.  Delayed renal images shows bilateral renal symmetrical excretion.  Bilateral  visualized proximal ureter is unremarkable.  Some stool noted in right colon. No small bowel obstruction. There is no pericecal inflammation. Normal appendix. The uterus and adnexa are unremarkable. Urinary bladder is unremarkable. No ascites or free air. No adenopathy. No destructive bony lesions are noted within pelvis. Impression: 1. No acute inflammatory process within abdomen or pelvis. Fatty infiltration of the liver. Status postcholecystectomy. 2. No hydronephrosis or hydroureter. 3. Some stool noted in right colon. No pericecal inflammation. Normal appendix. 4. No small bowel obstruction.  5. Degenerative changes thoracolumbar spine.   MDM   Final diagnoses:  Abdominal pain   Patient presents with abdominal pain. She is nontoxic on exam. The pain is acute on chronic. She does have some epigastric tenderness as well as left rib tenderness. Pain is somewhat pleuritic in nature.  Basic labs are reassuring including lipase and d-dimer. Chest x-ray shows no evidence of pneumothorax. On exam, patient continues to endorse pain; however, she does not want pain medication.  She states frustration with not know about is going on. Discuss with patient that I think a CT scan will likely be of low yield; however, given patient's continued pain, we'll obtain scan to rule out other etiology. CT scan negative. Patient able to tolerate by mouth. Patient is requesting blood pressure medication at discharge. We'll give a one-month course. She states that she will get insurance on may first and will establish primary care at that time.  After history, exam, and  medical workup I feel the patient has been appropriately medically screened and is safe for discharge home. Pertinent diagnoses were discussed with the patient. Patient was given return precautions.  I personally performed the services described in this documentation, which was scribed in my presence. The recorded information has been reviewed and is  accurate.     Merryl Hacker, MD 07/12/13 2140

## 2013-07-15 ENCOUNTER — Encounter: Payer: Self-pay | Admitting: Medical

## 2013-08-10 ENCOUNTER — Ambulatory Visit (INDEPENDENT_AMBULATORY_CARE_PROVIDER_SITE_OTHER): Payer: 59 | Admitting: Family

## 2013-08-10 ENCOUNTER — Telehealth: Payer: Self-pay | Admitting: *Deleted

## 2013-08-10 ENCOUNTER — Encounter: Payer: Self-pay | Admitting: Family

## 2013-08-10 VITALS — BP 122/82 | HR 66 | Temp 97.8°F | Resp 16 | Ht 60.5 in | Wt 176.1 lb

## 2013-08-10 DIAGNOSIS — F329 Major depressive disorder, single episode, unspecified: Secondary | ICD-10-CM

## 2013-08-10 DIAGNOSIS — F3289 Other specified depressive episodes: Secondary | ICD-10-CM

## 2013-08-10 DIAGNOSIS — M1990 Unspecified osteoarthritis, unspecified site: Secondary | ICD-10-CM

## 2013-08-10 DIAGNOSIS — M519 Unspecified thoracic, thoracolumbar and lumbosacral intervertebral disc disorder: Secondary | ICD-10-CM

## 2013-08-10 DIAGNOSIS — M545 Low back pain, unspecified: Secondary | ICD-10-CM

## 2013-08-10 DIAGNOSIS — R0789 Other chest pain: Secondary | ICD-10-CM

## 2013-08-10 DIAGNOSIS — R5383 Other fatigue: Secondary | ICD-10-CM

## 2013-08-10 DIAGNOSIS — F32A Depression, unspecified: Secondary | ICD-10-CM

## 2013-08-10 DIAGNOSIS — G4733 Obstructive sleep apnea (adult) (pediatric): Secondary | ICD-10-CM

## 2013-08-10 DIAGNOSIS — R5381 Other malaise: Secondary | ICD-10-CM

## 2013-08-10 DIAGNOSIS — E785 Hyperlipidemia, unspecified: Secondary | ICD-10-CM

## 2013-08-10 LAB — LIPID PANEL
Cholesterol: 258 mg/dL — ABNORMAL HIGH (ref 0–200)
HDL: 36 mg/dL — ABNORMAL LOW (ref >39)
LDL Cholesterol: 177 mg/dL — ABNORMAL HIGH (ref 0–99)
Total CHOL/HDL Ratio: 7.2 Ratio
Triglycerides: 225 mg/dL — ABNORMAL HIGH (ref <150)
VLDL: 45 mg/dL — AB (ref 0–40)

## 2013-08-10 LAB — TSH: TSH: 1.226 u[IU]/mL (ref 0.350–4.500)

## 2013-08-10 MED ORDER — OMEPRAZOLE 20 MG PO CPDR: 20.0000 mg | DELAYED_RELEASE_CAPSULE | Freq: Every day | ORAL | Status: DC

## 2013-08-10 MED ORDER — CITALOPRAM HYDROBROMIDE 20 MG PO TABS: ORAL_TABLET | ORAL | Status: DC

## 2013-08-10 MED ORDER — FUROSEMIDE 20 MG PO TABS: 20.0000 mg | ORAL_TABLET | Freq: Every day | ORAL | Status: DC

## 2013-08-10 NOTE — Progress Notes (Signed)
Subjective:    Patient ID: Kari [name], female    DOB: [birth year], 53 y.o.   MRN: [id number]  HPI  Kari [name] is a 53 yr old female who presents today to establish care.    1) Extreme Fatigue- has been present x 1 year. She has hx of OSA and has had 20 pound weight gain. No longer has CPAP. Reports that she had cpap 2001.    2) Back pain- was evaluated in urgent care 10/14 for this and had x ray of the lumbar spine.  Results reviewed:  Note is made of severe disc space narrowing T12-L1 and L1-2, osteophytes also noted.  Reports pain in the left flank under her ribs, worse a night. , rare right sided pain. Pressing relieves her pain.    3) Bilateral leg pain- notes hx of arthritis and balance issues.  Reports that she was diagnosed with arthritis  In her 31's. Recently she has been been using 2 alevel twice daily.  Has not been able to hike, dance etc.    4) Depression- has been on celexa for years.  She has been out since February.  5) Chest pain- both parents have CAD.  Reports that in January she had an episode of CP and went to ED in [place]. Reports that she had an EKG which per pt was OK. Reports that she attributes this to a panic attack. No chest pain since January. Would like "my arteries looked at." Mom with TIA, Cardiac stent  Review of Systems  Constitutional:       Reports gradual weight gain of 10 pounds in last 6 months due to inactivity  HENT: Negative for postnasal drip and rhinorrhea.   Respiratory: Negative for cough and shortness of breath.   Cardiovascular: Negative for chest pain.       Some swelling in hands/feet/face  Genitourinary: Negative for dysuria.  Musculoskeletal: Positive for arthralgias.  Neurological: Positive for headaches.  Hematological: Negative for adenopathy.  Psychiatric/Behavioral:       See HPI Denies current SI/HI    Past Medical History  Diagnosis Date  . UTI (lower urinary tract infection)   . Hypertension   . Depression    . Carpal tunnel syndrome, bilateral   . Arthritis   . Depression   . OSA (obstructive sleep apnea)     History   Social History  . Marital Status: Single    Spouse Name: N/A    Number of Children: N/A  . Years of Education: N/A   Occupational History  . Not on file.   Social History Main Topics  . Smoking status: Never Smoker   . Smokeless tobacco: Never Used  . Alcohol Use: Yes     Comment: once a month (social)  . Drug Use: No  . Sexual Activity: No   Other Topics Concern  . 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   Reports Associates degree in nursing   Divorced          Past Surgical History  Procedure Laterality Date  . Cholecystectomy, laparoscopic  2005  . Cesarean section      x 3    Family History  Problem Relation Age of Onset  .  Hypertension Mother   . Hyperlipidemia Mother   . Hypertension Father   . Heart attack Father   . Kidney Stones Brother   . Kidney Stones Son   . Cancer Maternal Aunt     breast  . Cancer Paternal Uncle     leukemia  . Diabetes Neg Hx   . Kidney Stones Son     Allergies  Allergen Reactions  . Macrodantin [Nitrofurantoin Macrocrystal] Itching  . Morphine And Related Itching    Current Outpatient Prescriptions on File Prior to Visit  Medication Sig Dispense Refill  . fexofenadine (ALLEGRA) 180 MG tablet Take 180 mg by mouth daily.      . naproxen sodium (ANAPROX) 220 MG tablet Take 440 mg by mouth 2 (two) times daily with a meal.       No current facility-administered medications on file prior to visit.    BP 122/82  Pulse 66  Temp(Src) 97.8 F (36.6 C) (Oral)  Resp 16  Ht 5' 0.5" (1.537 m)  Wt 176 lb 1.3 oz (79.869 kg)  BMI 33.81 kg/m2  SpO2 99%  LMP 03/25/2008       Objective:     Physical Exam  Constitutional: She is oriented to person, place, and time. She appears well-developed and well-nourished. No distress.  Eyes: No scleral icterus.  Cardiovascular: Normal rate and regular rhythm.   No murmur heard. Pulmonary/Chest: Effort normal. No respiratory distress. She has no wheezes. She has no rales. She exhibits no tenderness.  Musculoskeletal: She exhibits no edema.  Neurological: She is alert and oriented to person, place, and time.  Skin: Skin is warm and dry.  Psychiatric: Judgment and thought content normal.  Intermittently tearful          Assessment & Plan:

## 2013-08-10 NOTE — Patient Instructions (Signed)
Complete lab work prior to leaving. Restart citalopram 1/2 tab once daily for 1 week, then increase to a full tab once daily. Start otc prilosec once daily. We will arrange cardiology consult, MRI, sleep study and contact you with your appointments. Follow up in 1 month.

## 2013-08-10 NOTE — Telephone Encounter (Signed)
Received call from pt requesting Rx of pain medication.  She states that she has taken meloxicam in the past and it did not help.  Reports that she is taking max dose of Aleve per day and was started on omeprazole today to help protect her stomach while on Aleve. She wants to know if you will call in pain med?  Please advise.

## 2013-08-10 NOTE — Progress Notes (Signed)
Pre visit review using our clinic review tool, if applicable. No additional management support is needed unless otherwise documented below in the visit note. 

## 2013-08-11 ENCOUNTER — Encounter: Payer: Self-pay | Admitting: Family

## 2013-08-11 MED ORDER — METHYLPREDNISOLONE 4 MG PO KIT: PACK | ORAL | Status: DC

## 2013-08-11 MED ORDER — CYCLOBENZAPRINE HCL 5 MG PO TABS: 5.0000 mg | ORAL_TABLET | Freq: Every evening | ORAL | Status: DC | PRN

## 2013-08-11 NOTE — Telephone Encounter (Signed)
i would recommend trial of medrol dose pak and HS flexeril and completion of MRI.  Further recommendations after MRI review.

## 2013-08-11 NOTE — Telephone Encounter (Signed)
Notified pt of below recommendations and she voices understanding.

## 2013-08-13 DIAGNOSIS — M1990 Unspecified osteoarthritis, unspecified site: Secondary | ICD-10-CM

## 2013-08-13 DIAGNOSIS — R5383 Other fatigue: Secondary | ICD-10-CM

## 2013-08-13 DIAGNOSIS — F331 Major depressive disorder, recurrent, moderate: Secondary | ICD-10-CM | POA: Insufficient documentation

## 2013-08-13 DIAGNOSIS — M545 Low back pain, unspecified: Secondary | ICD-10-CM | POA: Insufficient documentation

## 2013-08-13 DIAGNOSIS — R0789 Other chest pain: Secondary | ICD-10-CM | POA: Insufficient documentation

## 2013-08-13 DIAGNOSIS — F329 Major depressive disorder, single episode, unspecified: Secondary | ICD-10-CM | POA: Insufficient documentation

## 2013-08-13 HISTORY — DX: Low back pain, unspecified: M54.50

## 2013-08-13 HISTORY — DX: Other fatigue: R53.83

## 2013-08-13 HISTORY — DX: Unspecified osteoarthritis, unspecified site: M19.90

## 2013-08-13 HISTORY — DX: Major depressive disorder, recurrent, moderate: F33.1

## 2013-08-13 NOTE — Assessment & Plan Note (Signed)
Suspect secondary to untreated OSA. Will order split night study.

## 2013-08-13 NOTE — Assessment & Plan Note (Signed)
Resolved. She is interested in further cardiac testing due to strong family hx of CAD.  Will refer to cardiology for further evaluation. EKG performed today in the office notes NSR without acute changes.

## 2013-08-13 NOTE — Assessment & Plan Note (Signed)
Deteriorated.  Resume citalopram.

## 2013-08-13 NOTE — Assessment & Plan Note (Signed)
Advised pt to use NSAIDS sparingly. Add omeprazole for GI protection.

## 2013-08-13 NOTE — Assessment & Plan Note (Signed)
Will refer for MRI of the lumbar spine to further evaluate.

## 2013-08-17 ENCOUNTER — Telehealth: Payer: Self-pay | Admitting: Family

## 2013-08-17 NOTE — Telephone Encounter (Signed)
Left message for pt to return my call.

## 2013-08-17 NOTE — Telephone Encounter (Signed)
Unfortunately, medrol should not be continued long term.  She should complete dose pak and follow through with MRI. Further recommendations pending MRI review. I will check status of MRI scheduling.

## 2013-08-17 NOTE — Telephone Encounter (Signed)
Patient states that the rx medrol is "working wonders" for her. She says that she is almost out and would like to know if Lenna Sciara would continue writing this rx for her?

## 2013-08-18 NOTE — Telephone Encounter (Signed)
Message copied by Debbrah Alar on Wed Aug 18, 2013  8:11 AM ------      Message from: Synthia Innocent      Created: Wed Aug 18, 2013  7:25 AM       I have spoken to the patient, we are still awaiting insurance info. Thanks      ----- Message -----         From: Debbrah Alar, NP         Sent: 08/17/2013   2:34 PM           To: Synthia Innocent            What is status of MRI scheduling please       ------

## 2013-08-18 NOTE — Telephone Encounter (Signed)
Notified pt of below recommendations. Pt became very upset and states she "needs a daily dose of prednisone until she can arrange surgery for her back as she needs to be able to function and do her job". States her pain stopped while on dose pack but has returned now that course is complete. Advised pt I would let her know if there are any other recommendations and she states she wants a callback either way or she is going to see someone else. Please advise.

## 2013-08-18 NOTE — Telephone Encounter (Signed)
Notified pt and she voices understanding. Advised her that we are awaiting a copy of her insurance card to obtain authorization to do the MRI. Pt states she hasn't received a copy of the card yet. Insurance is with [facility]. Advised pt to call insurance and ask them to fax of proof of income and contact #s to go forward with MRI. Pt voices understanding.

## 2013-08-18 NOTE — Telephone Encounter (Signed)
I have no further recommendations other than to continue PRN anti-inflammatory- (aleve) and tylenol.  Chronic oral steroids are not a standard of care for chronic back pain. She may be a candidate for epidural steroid injections but we will need to evaluate her MRI first before that can be determined.

## 2013-08-20 ENCOUNTER — Ambulatory Visit (HOSPITAL_BASED_OUTPATIENT_CLINIC_OR_DEPARTMENT_OTHER): Payer: 59 | Attending: Family

## 2013-08-20 VITALS — Ht 62.0 in | Wt 170.0 lb

## 2013-08-20 DIAGNOSIS — G4733 Obstructive sleep apnea (adult) (pediatric): Secondary | ICD-10-CM | POA: Insufficient documentation

## 2013-08-27 ENCOUNTER — Telehealth: Payer: Self-pay | Admitting: Family

## 2013-08-27 DIAGNOSIS — M549 Dorsalgia, unspecified: Secondary | ICD-10-CM

## 2013-08-27 NOTE — Telephone Encounter (Signed)
Pt is requesting MRI of T and L Spine, does have order for L spine

## 2013-08-30 DIAGNOSIS — G4730 Sleep apnea, unspecified: Secondary | ICD-10-CM

## 2013-08-30 DIAGNOSIS — G4710 Hypersomnia, unspecified: Secondary | ICD-10-CM

## 2013-08-30 NOTE — Sleep Study (Signed)
[place]  NAME: Kari [name]  DATE OF BIRTH: 1960-10-04  MEDICAL RECORD NUMBER [id number]  LOCATION: [facility]  PHYSICIAN: Rigoberto Noel MD DATE OF STUDY: 08/20/13   SLEEP STUDY TYPE: Split Polysomnogram               REFERRING PHYSICIAN: Rigoberto Noel, MD  INDICATION FOR STUDY: 53 year old woman with extreme fatigue, loud snoring and history of OSA diagnosed in 2001, not on CPAP therapy. At the time of this study ,they weighed 170 pounds with a height of  5 ft 2 inches and the BMI of 31, neck size of 14 inches. Epworth sleepiness score was 15   This intervention polysomnogram was performed with a sleep technologist in attendance. EEG, EOG,EMG and respiratory parameters recorded. Sleep stages, arousals, limb movements and respiratory data was scored according to criteria laid out by the American Academy of sleep medicine.  SLEEP ARCHITECTURE: Lights out was at 2139 PM and lights on was at 5 AM. During the baseline portion ,total sleep time was 122 minutes with sleep latency of 23 minutes with latency to REM sleep of 64 6 minutes and wake after sleep onset of 6 minutes. Sleep efficiency was poor at 81%. Sleep stages as a percentage of total sleep time was N1 -1.6 %,N2- 73 % and REM sleep 21 % ( 26 minutes) . During titration REM sleep accounted for 110 minutes and supine sleep was noted .The longest period of REM sleep was around 2 AM.   AROUSAL DATA : At baseline ,there were 26 arousals with an arousal index of 12.7 events per hour. Of these 19 were spontaneous, and 7 were associated with respiratory events and 0 were associated periodic limb movements. During titration, the arousal index was 6  events per hour  RESPIRATORY DATA: During the baseline portion,there were 11 obstructive apneas, 0 central apneas, 2 mixed apneas and 18 hypopneas with apnea -hypopnea index of 15 events per hour.  There was no relation to sleep stage or body  position. Due to this degree of respiratory disturbance CPAP was initiated at 4 cm and titrated to find a level of 12 cm due to persistent events during supine sleep. At the level of 12 cm for 41 minutes of sleep, 5 central apneas and 0 hypopneas is were noted. This appears to be the optimal level used  MOVEMENT/PARASOMNIA: There were 0 PLMS with a PLM index of 0 events per hour. The PLM arousal index was 0 events per hour.   OXYGEN DATA: The lowest desaturation was 86 % during non-REM sleep and the desaturation index was 12 per hour. This was corrected by titration and the saturations stayed below 88% for 1.4 minutes during titration.  CARDIAC DATA: The low heart rate was 35 beats per minute. The high heart rate recorded was an artifact. No arrhythmias were noted   IMPRESSION :  1. moderate obstructive sleep apnea with hypopneas causing sleep fragmentation and mild oxygen desaturation. 2. This was corrected by CPAP of 12 centimeters with medium fullface mask. Titration was optimal 3. No evidence of cardiac arrhythmias or behavioral disturbance during sleep. Periodic limb movements were not noted.  RECOMMENDATION:    1. The treatment options for this degree of sleep disordered breathing includes weight loss and CPAP therapy. 2. CPAP can be initiated at 12 centimeters with a medium fullface mask and compliance monitored at this level. 3. Patient should be cautioned against driving when sleepy.They should be asked to  avoid medications with sedative side effects  Rigoberto Noel MD Diplomate, American Board of Sleep Medicine  ELECTRONICALLY SIGNED ON:  08/30/2013  [facility] PH: [phone]   FX: [phone] [facility]

## 2013-08-31 ENCOUNTER — Telehealth: Payer: Self-pay | Admitting: Family

## 2013-08-31 DIAGNOSIS — G4733 Obstructive sleep apnea (adult) (pediatric): Secondary | ICD-10-CM | POA: Insufficient documentation

## 2013-08-31 NOTE — Telephone Encounter (Signed)
[facility] Laroche- please notify pt-Reviewed sleep study. Confirms OSA.  Will arrrange CPAP through home health.  Follow up in 6-8 weeks.   Anderson Malta- I am no longer able to place order for DME supply through home health referral.  Could you please contact [facility] supply to bring pt CPAP for dx of OSA?  She will need to start with the following settings 40mm with med full face mask.  Download in 4 weeks.

## 2013-09-01 NOTE — Telephone Encounter (Signed)
Order faxed to [facility]

## 2013-09-01 NOTE — Telephone Encounter (Signed)
Are you able to place referral? Or I will need written order for [place]

## 2013-09-01 NOTE — Telephone Encounter (Signed)
Notified pt and she is agreeable to proceed with referral. F/u scheduled for 10/13/13 at 1:15pm.   Delsa Sale-- you can fax this phone note or call [facility] and they may have an order form that they want [place] to complete.

## 2013-09-04 ENCOUNTER — Ambulatory Visit (HOSPITAL_BASED_OUTPATIENT_CLINIC_OR_DEPARTMENT_OTHER)
Admission: RE | Admit: 2013-09-04 | Discharge: 2013-09-04 | Disposition: A | Discharge status: Home / Self Care | Payer: 59 | Source: Ambulatory Visit | Attending: Family | Admitting: Family

## 2013-09-04 DIAGNOSIS — M7989 Other specified soft tissue disorders: Secondary | ICD-10-CM | POA: Insufficient documentation

## 2013-09-04 DIAGNOSIS — M549 Dorsalgia, unspecified: Secondary | ICD-10-CM | POA: Insufficient documentation

## 2013-09-04 DIAGNOSIS — R209 Unspecified disturbances of skin sensation: Secondary | ICD-10-CM | POA: Diagnosis not present

## 2013-09-04 DIAGNOSIS — M5124 Other intervertebral disc displacement, thoracic region: Secondary | ICD-10-CM | POA: Diagnosis not present

## 2013-09-04 DIAGNOSIS — M519 Unspecified thoracic, thoracolumbar and lumbosacral intervertebral disc disorder: Secondary | ICD-10-CM

## 2013-09-07 ENCOUNTER — Telehealth: Payer: Self-pay | Admitting: Family

## 2013-09-07 NOTE — Telephone Encounter (Signed)
Patient left message returning call.

## 2013-09-08 ENCOUNTER — Telehealth: Payer: Self-pay | Admitting: Physician Assistant

## 2013-09-08 DIAGNOSIS — IMO0002 Reserved for concepts with insufficient information to code with codable children: Secondary | ICD-10-CM

## 2013-09-08 NOTE — Telephone Encounter (Signed)
Message copied by Raiford Noble on Wed Sep 08, 2013  5:01 PM ------      Message from: Ralph Dowdy      Created: Wed Sep 08, 2013  3:59 PM       Patient aware of results and voiced understanding.  She is interested in the neurosurgeon referral. ------

## 2013-09-08 NOTE — Telephone Encounter (Signed)
Spoke with patient regarding her MRI results.

## 2013-09-08 NOTE — Telephone Encounter (Signed)
Referral placed.  She will be contacted by specialty.

## 2013-09-10 ENCOUNTER — Telehealth: Payer: Self-pay | Admitting: Family

## 2013-09-10 NOTE — Telephone Encounter (Signed)
error 

## 2013-09-14 ENCOUNTER — Telehealth: Payer: Self-pay | Admitting: Family

## 2013-09-14 NOTE — Telephone Encounter (Signed)
Returned pt's call. LMOVM for pt to return call.

## 2013-09-14 NOTE — Telephone Encounter (Signed)
Patient left message requesting a call back from [facility]

## 2013-09-15 MED ORDER — METHYLPREDNISOLONE 4 MG PO KIT: PACK | ORAL | Status: DC

## 2013-09-15 NOTE — Telephone Encounter (Signed)
Please advise 

## 2013-09-15 NOTE — Telephone Encounter (Signed)
Patient returned phone call. She states that it will be awhile before she can get in to see a neurosurgeon and would like to know if another medrol dose pack could be called in. Patient would like a callback.

## 2013-09-15 NOTE — Telephone Encounter (Signed)
I will repeat medrol dose pak 1 additional time.

## 2013-09-15 NOTE — Telephone Encounter (Signed)
Patient notified

## 2013-09-20 ENCOUNTER — Telehealth: Payer: Self-pay | Admitting: Family

## 2013-09-20 NOTE — Telephone Encounter (Signed)
Patient states an order was sent to [facility] for a cpap for her.  She wants her order sent to [place] as [employer] is hard to deal with.

## 2013-09-21 NOTE — Telephone Encounter (Signed)
Spoke with [facility] supply and asked them to cancel order for CPAP. Notified pt I am faxing order(08/31/13 phone note) to [facility] at her request [phone]).

## 2013-09-28 ENCOUNTER — Telehealth: Payer: Self-pay | Admitting: *Deleted

## 2013-09-28 DIAGNOSIS — G4733 Obstructive sleep apnea (adult) (pediatric): Secondary | ICD-10-CM

## 2013-09-28 NOTE — Telephone Encounter (Signed)
Anderson Malta, please fax copy of sleep study and my last office note + order to [facility]. Thanks.

## 2013-09-28 NOTE — Telephone Encounter (Signed)
Received call from pt stating she has spoken with [facility] and they tell her they are waiting on information from [place] re: CPAP settings. Advised pt we sent this info to them but I would call to clarify what is needed. Spoke with [facility] at [phone], she states they need a written rx for CPAP and settings. They also need face to face encounter and sleep study (full report) fax to [phone].

## 2013-09-29 NOTE — Telephone Encounter (Signed)
done

## 2013-09-30 ENCOUNTER — Ambulatory Visit: Payer: 59 | Admitting: Interventional Cardiology

## 2013-10-01 NOTE — Telephone Encounter (Signed)
Spoke with Huey Romans, they need actually script to be on prescription paper

## 2013-10-01 NOTE — Telephone Encounter (Signed)
rx given to [facility].

## 2013-10-13 ENCOUNTER — Ambulatory Visit: Payer: 59 | Admitting: Family

## 2016-01-12 ENCOUNTER — Encounter: Payer: Self-pay | Admitting: Pulmonary Disease

## 2017-11-03 ENCOUNTER — Ambulatory Visit: Payer: Self-pay | Admitting: Family Medicine

## 2017-11-19 ENCOUNTER — Ambulatory Visit: Payer: Self-pay | Admitting: Family Medicine

## 2018-01-06 DIAGNOSIS — G44221 Chronic tension-type headache, intractable: Secondary | ICD-10-CM | POA: Insufficient documentation

## 2018-01-06 HISTORY — DX: Chronic tension-type headache, intractable: G44.221

## 2018-03-26 DIAGNOSIS — Z6830 Body mass index (BMI) 30.0-30.9, adult: Secondary | ICD-10-CM | POA: Diagnosis not present

## 2018-03-26 DIAGNOSIS — R29898 Other symptoms and signs involving the musculoskeletal system: Secondary | ICD-10-CM | POA: Diagnosis not present

## 2018-03-26 DIAGNOSIS — M4725 Other spondylosis with radiculopathy, thoracolumbar region: Secondary | ICD-10-CM | POA: Diagnosis not present

## 2018-03-26 DIAGNOSIS — R200 Anesthesia of skin: Secondary | ICD-10-CM | POA: Diagnosis not present

## 2018-06-24 DIAGNOSIS — J040 Acute laryngitis: Secondary | ICD-10-CM | POA: Diagnosis not present

## 2018-11-10 DIAGNOSIS — M546 Pain in thoracic spine: Secondary | ICD-10-CM | POA: Diagnosis not present

## 2018-11-10 DIAGNOSIS — R300 Dysuria: Secondary | ICD-10-CM | POA: Diagnosis not present

## 2018-11-10 DIAGNOSIS — M4726 Other spondylosis with radiculopathy, lumbar region: Secondary | ICD-10-CM | POA: Diagnosis not present

## 2018-11-10 DIAGNOSIS — G8929 Other chronic pain: Secondary | ICD-10-CM | POA: Diagnosis not present

## 2018-11-10 DIAGNOSIS — R7303 Prediabetes: Secondary | ICD-10-CM | POA: Diagnosis not present

## 2018-11-27 DIAGNOSIS — M5136 Other intervertebral disc degeneration, lumbar region: Secondary | ICD-10-CM | POA: Diagnosis not present

## 2018-11-27 DIAGNOSIS — M47816 Spondylosis without myelopathy or radiculopathy, lumbar region: Secondary | ICD-10-CM | POA: Diagnosis not present

## 2018-11-27 DIAGNOSIS — M546 Pain in thoracic spine: Secondary | ICD-10-CM | POA: Diagnosis not present

## 2018-11-27 DIAGNOSIS — M5124 Other intervertebral disc displacement, thoracic region: Secondary | ICD-10-CM | POA: Diagnosis not present

## 2018-11-27 DIAGNOSIS — M5127 Other intervertebral disc displacement, lumbosacral region: Secondary | ICD-10-CM | POA: Diagnosis not present

## 2018-11-27 DIAGNOSIS — M48061 Spinal stenosis, lumbar region without neurogenic claudication: Secondary | ICD-10-CM | POA: Diagnosis not present

## 2018-12-01 DIAGNOSIS — Z79899 Other long term (current) drug therapy: Secondary | ICD-10-CM | POA: Diagnosis not present

## 2018-12-01 DIAGNOSIS — E785 Hyperlipidemia, unspecified: Secondary | ICD-10-CM | POA: Diagnosis not present

## 2018-12-01 DIAGNOSIS — R7303 Prediabetes: Secondary | ICD-10-CM | POA: Diagnosis not present

## 2018-12-03 DIAGNOSIS — J40 Bronchitis, not specified as acute or chronic: Secondary | ICD-10-CM | POA: Diagnosis not present

## 2018-12-03 DIAGNOSIS — J329 Chronic sinusitis, unspecified: Secondary | ICD-10-CM | POA: Diagnosis not present

## 2018-12-08 DIAGNOSIS — E785 Hyperlipidemia, unspecified: Secondary | ICD-10-CM | POA: Diagnosis not present

## 2018-12-08 DIAGNOSIS — R7303 Prediabetes: Secondary | ICD-10-CM | POA: Diagnosis not present

## 2019-01-06 DIAGNOSIS — I10 Essential (primary) hypertension: Secondary | ICD-10-CM | POA: Diagnosis not present

## 2019-02-17 ENCOUNTER — Other Ambulatory Visit: Payer: Self-pay

## 2019-02-17 NOTE — Patient Outreach (Signed)
[name] Care Management  02/17/2019  Kari [name] [birth year] [id number]   Medication Adherence call to [name] HIPPA Compliant Voice message left with a call back number. [name] is showing past due on Metformin 1000 mg under [place].   [facility] Management Direct Dial [phone]  Fax [phone] [name]

## 2019-03-24 DIAGNOSIS — R7303 Prediabetes: Secondary | ICD-10-CM | POA: Diagnosis not present

## 2019-03-24 DIAGNOSIS — G8929 Other chronic pain: Secondary | ICD-10-CM | POA: Diagnosis not present

## 2019-03-24 DIAGNOSIS — M549 Dorsalgia, unspecified: Secondary | ICD-10-CM | POA: Diagnosis not present

## 2019-03-24 DIAGNOSIS — Z79899 Other long term (current) drug therapy: Secondary | ICD-10-CM | POA: Diagnosis not present

## 2019-03-24 DIAGNOSIS — I10 Essential (primary) hypertension: Secondary | ICD-10-CM | POA: Diagnosis not present

## 2019-06-15 DIAGNOSIS — Z0184 Encounter for antibody response examination: Secondary | ICD-10-CM | POA: Diagnosis not present

## 2019-06-15 DIAGNOSIS — G8929 Other chronic pain: Secondary | ICD-10-CM | POA: Diagnosis not present

## 2019-06-15 DIAGNOSIS — R109 Unspecified abdominal pain: Secondary | ICD-10-CM | POA: Diagnosis not present

## 2019-06-15 DIAGNOSIS — M545 Low back pain: Secondary | ICD-10-CM | POA: Diagnosis not present

## 2019-06-29 DIAGNOSIS — R109 Unspecified abdominal pain: Secondary | ICD-10-CM | POA: Diagnosis not present

## 2019-06-29 DIAGNOSIS — Z0000 Encounter for general adult medical examination without abnormal findings: Secondary | ICD-10-CM | POA: Diagnosis not present

## 2019-06-29 DIAGNOSIS — M21962 Unspecified acquired deformity of left lower leg: Secondary | ICD-10-CM | POA: Diagnosis not present

## 2019-06-29 DIAGNOSIS — M5126 Other intervertebral disc displacement, lumbar region: Secondary | ICD-10-CM | POA: Diagnosis not present

## 2019-07-06 DIAGNOSIS — R0600 Dyspnea, unspecified: Secondary | ICD-10-CM | POA: Diagnosis not present

## 2019-07-06 DIAGNOSIS — M549 Dorsalgia, unspecified: Secondary | ICD-10-CM | POA: Diagnosis not present

## 2019-07-06 DIAGNOSIS — G8929 Other chronic pain: Secondary | ICD-10-CM | POA: Diagnosis not present

## 2019-07-06 DIAGNOSIS — S93492A Sprain of other ligament of left ankle, initial encounter: Secondary | ICD-10-CM | POA: Diagnosis not present

## 2019-07-06 DIAGNOSIS — Z23 Encounter for immunization: Secondary | ICD-10-CM | POA: Diagnosis not present

## 2019-07-15 DIAGNOSIS — M4724 Other spondylosis with radiculopathy, thoracic region: Secondary | ICD-10-CM | POA: Diagnosis not present

## 2019-07-15 DIAGNOSIS — M546 Pain in thoracic spine: Secondary | ICD-10-CM | POA: Diagnosis not present

## 2019-07-15 DIAGNOSIS — M4726 Other spondylosis with radiculopathy, lumbar region: Secondary | ICD-10-CM | POA: Diagnosis not present

## 2019-07-23 DIAGNOSIS — M5416 Radiculopathy, lumbar region: Secondary | ICD-10-CM | POA: Diagnosis not present

## 2019-07-23 DIAGNOSIS — M5126 Other intervertebral disc displacement, lumbar region: Secondary | ICD-10-CM | POA: Diagnosis not present

## 2019-08-16 DIAGNOSIS — M5126 Other intervertebral disc displacement, lumbar region: Secondary | ICD-10-CM | POA: Diagnosis not present

## 2019-08-27 DIAGNOSIS — R0600 Dyspnea, unspecified: Secondary | ICD-10-CM | POA: Diagnosis not present

## 2019-08-27 DIAGNOSIS — R7303 Prediabetes: Secondary | ICD-10-CM | POA: Diagnosis not present

## 2019-08-27 DIAGNOSIS — M549 Dorsalgia, unspecified: Secondary | ICD-10-CM | POA: Diagnosis not present

## 2019-08-27 DIAGNOSIS — R5383 Other fatigue: Secondary | ICD-10-CM | POA: Diagnosis not present

## 2019-08-27 DIAGNOSIS — Z79899 Other long term (current) drug therapy: Secondary | ICD-10-CM | POA: Diagnosis not present

## 2019-08-27 DIAGNOSIS — R5381 Other malaise: Secondary | ICD-10-CM | POA: Diagnosis not present

## 2019-09-14 ENCOUNTER — Encounter (HOSPITAL_COMMUNITY): Payer: Self-pay | Admitting: Emergency Medicine

## 2019-09-14 ENCOUNTER — Emergency Department (HOSPITAL_COMMUNITY)
Admission: EM | Admit: 2019-09-14 | Discharge: 2019-09-14 | Disposition: A | Discharge status: Home / Self Care | Payer: Medicare Other | Attending: Emergency Medicine | Admitting: Emergency Medicine

## 2019-09-14 DIAGNOSIS — M542 Cervicalgia: Secondary | ICD-10-CM | POA: Diagnosis present

## 2019-09-14 DIAGNOSIS — R739 Hyperglycemia, unspecified: Secondary | ICD-10-CM | POA: Insufficient documentation

## 2019-09-14 DIAGNOSIS — Z5321 Procedure and treatment not carried out due to patient leaving prior to being seen by health care provider: Secondary | ICD-10-CM | POA: Insufficient documentation

## 2019-09-14 DIAGNOSIS — R109 Unspecified abdominal pain: Secondary | ICD-10-CM | POA: Diagnosis not present

## 2019-09-14 LAB — URINALYSIS, ROUTINE W REFLEX MICROSCOPIC
Bilirubin Urine: NEGATIVE
Glucose, UA: NEGATIVE mg/dL
Hgb urine dipstick: NEGATIVE
Ketones, ur: NEGATIVE mg/dL
Leukocytes,Ua: NEGATIVE
Nitrite: NEGATIVE
Protein, ur: NEGATIVE mg/dL
Specific Gravity, Urine: 1.02 (ref 1.005–1.030)
pH: 5 (ref 5.0–8.0)

## 2019-09-14 LAB — CBG MONITORING, ED: Glucose-Capillary: 112 mg/dL — ABNORMAL HIGH (ref 70–99)

## 2019-09-14 NOTE — ED Notes (Signed)
No answer for lab draw.

## 2019-09-14 NOTE — ED Triage Notes (Signed)
Patient here from home reporting mid to lower back pain "for awhile" and flank pain with odor to urine. Reports that she has been seen by PCP multiple times for same with no relief. Also reports hyperglycemia, denies being diabetic but takes Metformin.

## 2019-09-22 DIAGNOSIS — R109 Unspecified abdominal pain: Secondary | ICD-10-CM | POA: Diagnosis not present

## 2019-09-23 DIAGNOSIS — K439 Ventral hernia without obstruction or gangrene: Secondary | ICD-10-CM | POA: Diagnosis not present

## 2019-09-23 DIAGNOSIS — I700 Atherosclerosis of aorta: Secondary | ICD-10-CM | POA: Diagnosis not present

## 2019-09-23 DIAGNOSIS — M545 Low back pain: Secondary | ICD-10-CM | POA: Diagnosis not present

## 2019-09-23 DIAGNOSIS — R109 Unspecified abdominal pain: Secondary | ICD-10-CM | POA: Diagnosis not present

## 2019-10-05 DIAGNOSIS — G4730 Sleep apnea, unspecified: Secondary | ICD-10-CM | POA: Diagnosis not present

## 2019-12-17 DIAGNOSIS — E785 Hyperlipidemia, unspecified: Secondary | ICD-10-CM | POA: Diagnosis not present

## 2019-12-17 DIAGNOSIS — Z79899 Other long term (current) drug therapy: Secondary | ICD-10-CM | POA: Diagnosis not present

## 2019-12-17 DIAGNOSIS — R7303 Prediabetes: Secondary | ICD-10-CM | POA: Diagnosis not present

## 2019-12-17 DIAGNOSIS — G4733 Obstructive sleep apnea (adult) (pediatric): Secondary | ICD-10-CM | POA: Diagnosis not present

## 2020-01-07 DIAGNOSIS — G4733 Obstructive sleep apnea (adult) (pediatric): Secondary | ICD-10-CM | POA: Diagnosis not present

## 2020-01-27 DIAGNOSIS — J4540 Moderate persistent asthma, uncomplicated: Secondary | ICD-10-CM | POA: Diagnosis not present

## 2020-01-27 DIAGNOSIS — J301 Allergic rhinitis due to pollen: Secondary | ICD-10-CM | POA: Diagnosis not present

## 2020-01-27 DIAGNOSIS — R5383 Other fatigue: Secondary | ICD-10-CM | POA: Diagnosis not present

## 2020-01-27 DIAGNOSIS — G4733 Obstructive sleep apnea (adult) (pediatric): Secondary | ICD-10-CM | POA: Diagnosis not present

## 2020-02-05 DIAGNOSIS — E7800 Pure hypercholesterolemia, unspecified: Secondary | ICD-10-CM | POA: Diagnosis not present

## 2020-02-05 DIAGNOSIS — Z9049 Acquired absence of other specified parts of digestive tract: Secondary | ICD-10-CM | POA: Diagnosis not present

## 2020-02-05 DIAGNOSIS — R1031 Right lower quadrant pain: Secondary | ICD-10-CM | POA: Diagnosis not present

## 2020-02-05 DIAGNOSIS — R110 Nausea: Secondary | ICD-10-CM | POA: Diagnosis not present

## 2020-02-05 DIAGNOSIS — R197 Diarrhea, unspecified: Secondary | ICD-10-CM | POA: Diagnosis not present

## 2020-02-05 DIAGNOSIS — E119 Type 2 diabetes mellitus without complications: Secondary | ICD-10-CM | POA: Diagnosis not present

## 2020-02-05 DIAGNOSIS — R079 Chest pain, unspecified: Secondary | ICD-10-CM | POA: Diagnosis not present

## 2020-02-05 DIAGNOSIS — M546 Pain in thoracic spine: Secondary | ICD-10-CM | POA: Diagnosis not present

## 2020-02-05 DIAGNOSIS — K439 Ventral hernia without obstruction or gangrene: Secondary | ICD-10-CM | POA: Diagnosis not present

## 2020-02-05 DIAGNOSIS — Z8719 Personal history of other diseases of the digestive system: Secondary | ICD-10-CM | POA: Diagnosis not present

## 2020-02-05 DIAGNOSIS — M1990 Unspecified osteoarthritis, unspecified site: Secondary | ICD-10-CM | POA: Diagnosis not present

## 2020-02-05 DIAGNOSIS — I10 Essential (primary) hypertension: Secondary | ICD-10-CM | POA: Diagnosis not present

## 2020-02-05 DIAGNOSIS — M545 Low back pain, unspecified: Secondary | ICD-10-CM | POA: Diagnosis not present

## 2020-02-25 ENCOUNTER — Telehealth: Payer: Self-pay | Admitting: Gastroenterology

## 2020-02-25 ENCOUNTER — Encounter: Payer: Self-pay | Admitting: Gastroenterology

## 2020-02-25 ENCOUNTER — Telehealth: Payer: Self-pay

## 2020-02-25 ENCOUNTER — Ambulatory Visit: Payer: Medicare Other | Admitting: Gastroenterology

## 2020-02-25 ENCOUNTER — Other Ambulatory Visit (INDEPENDENT_AMBULATORY_CARE_PROVIDER_SITE_OTHER): Payer: Medicare Other

## 2020-02-25 VITALS — BP 158/100 | HR 77 | Ht 60.6 in | Wt 167.0 lb

## 2020-02-25 DIAGNOSIS — K7581 Nonalcoholic steatohepatitis (NASH): Secondary | ICD-10-CM | POA: Diagnosis not present

## 2020-02-25 DIAGNOSIS — R932 Abnormal findings on diagnostic imaging of liver and biliary tract: Secondary | ICD-10-CM

## 2020-02-25 DIAGNOSIS — R945 Abnormal results of liver function studies: Secondary | ICD-10-CM | POA: Diagnosis not present

## 2020-02-25 LAB — COMPREHENSIVE METABOLIC PANEL
ALT: 24 U/L (ref 0–35)
AST: 18 U/L (ref 0–37)
Albumin: 4.7 g/dL (ref 3.5–5.2)
Alkaline Phosphatase: 68 U/L (ref 39–117)
BUN: 14 mg/dL (ref 6–23)
CO2: 25 mEq/L (ref 19–32)
Calcium: 10.1 mg/dL (ref 8.4–10.5)
Chloride: 103 mEq/L (ref 96–112)
Creatinine, Ser: 0.69 mg/dL (ref 0.40–1.20)
GFR: 95.15 mL/min (ref >60.00)
Glucose, Bld: 99 mg/dL (ref 70–99)
Potassium: 4.1 mEq/L (ref 3.5–5.1)
Sodium: 138 mEq/L (ref 135–145)
Total Bilirubin: 0.4 mg/dL (ref 0.2–1.2)
Total Protein: 7.8 g/dL (ref 6.0–8.3)

## 2020-02-25 LAB — CBC
HCT: 44.1 % (ref 36.0–46.0)
Hemoglobin: 15 g/dL (ref 12.0–15.0)
MCHC: 34 g/dL (ref 30.0–36.0)
MCV: 87 fl (ref 78.0–100.0)
Platelets: 371 10*3/uL (ref 150.0–400.0)
RBC: 5.07 Mil/uL (ref 3.87–5.11)
RDW: 13.7 % (ref 11.5–15.5)
WBC: 11.5 10*3/uL — ABNORMAL HIGH (ref 4.0–10.5)

## 2020-02-25 LAB — PROTIME-INR
INR: 1 ratio (ref 0.8–1.0)
Prothrombin Time: 11.7 s (ref 9.6–13.1)

## 2020-02-25 NOTE — Telephone Encounter (Signed)
Pt Is requesting a call back from a nurse regarding her lab results.

## 2020-02-25 NOTE — Telephone Encounter (Signed)
See result note.  

## 2020-02-25 NOTE — Telephone Encounter (Signed)
FAXED Kari [name]: [facility]  Document: [facility] Records requested: 2015 colonoscopy and path  Above requested information has been faxed successfully to the to the facility listed above. Documents and fax confirmation have been placed in the faxed file for future reference.

## 2020-02-25 NOTE — Progress Notes (Signed)
Referring Provider: Debbrah Alar, NP Primary Care Physician:  Ronita Hipps, MD  Reason for Consultation: Fatty liver   IMPRESSION:  Abnormal liver on CT scan    - fatty liver on 09/2019    - Prominent liver on more recent noncontrast CT scan Normal liver enzymes BMI 31.97 Patient concerns for focal jaundice under her eyes Prior colonoscopy in 2015 at [facility] Diverticulosis  Abnormal liver on CT scan: At risk for fatty liver, which is supported by prior CT scan. No evidence for advanced fibrosis or cirrhosis. Fortunately, her concerns about yellowing under her eyes due to clinically support a diagnosis of jaundice.  Discussed that she shouldn't stop her medications without the guidance of her primary care provider, and particularly that metformin is often used in the setting of fatty liver with concurrent insulin resistance.  It's unclear if these findings explain her symptoms of fatigue, abdominal pain, and back pain. Other etiologies must be considered.  Offered referral to [facility] given her concerns.   Colonoscopy cancer screening: Obtain colonoscopy report from 2015. Will obtain records to determine surveillance.   PLAN: CMP, CBC, PT/INR NASH FibroSURE Elastography Obtain colonoscopy report from 2015 at [facility] with Dr. Helene Kelp re: medication changes  Please see the "Patient Instructions" section for addition details about the plan.  HPI: Kari [name] is a 59 y.o. female self-referred for fatty liver.  The history is obtained through the patient, records review obtained from [name], and review of her electronic health record.  She has previously been followed by [facility] in [facility].  Her primary care doctor is Dr. Yetta Barre.  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  She has diabetes, depression, diverticulosis, hypertension, sleep apnea on CPAP, arthritis, hypercholesterolemia, thoracic  radiculopathy. She had a cholecystectomy 2005.   Self referred by fatty liver. Notes multiple abdominal images over the years have shown fatty liver. Feels like her severe fatigue, abdominal pain, and back pain are due to her liver.  Her primary care provider told her to watch her blood sugars and eat right.   She is concerned because she described "jaundice" under her eyes.   During a recent evaluation in the emergency room at [facility] for back/flank pain a CT of the abdomen and pelvis without contrast showed a liver measuring 20.4 cm and colonic diverticulosis.  Fatty liver not mentioned. Gallbladder is absent.  Labs from that ER evaluation on 02/05/2020 showed WBC 7.5, hemoglobin 13.5, platelets 338, normal comprehensive metabolic panel including liver enzymes.  Normal lipase.  Severe fatigue such that she is unable to work. She goes to bed at 3pm in the afternoon.   Quit taking meloxicam and metformin after her ER visit in an effort to protect her liver. She also stopped eating sugars.  Has used multiple supplements over time.  Does not drink alcohol. Rare marijuana use.   Doing water aerobics after reviewing her CT scan from the ED to treat her fatty liver.   Review of her electronic health record shows a  CT abd/pelvis 03/22/16: stable hemangioma, prior cholecystectomy, fatty liver, ovarian cyst CT abd/pelvis 09/23/19: possible hepatic steatosis, 3cm left ovarian cyst, ventral hernia.   History of pelvic pain previously evaluation at [facility] in 2018. Office notes at that time document intermittent pain since 2013. Pain described as a lower back that wraps around to the front with associated bloating.  In 2017 had a CT scan and was told cysts on  kidneys and ovaries as well as liver hemangioma. Transvaginal ultrasound 2018 showed a small anterior myoma and small left cyst.   Evaluated by [facility] for oropharyngeal dysphagia 03/2018. Barium esophagram showed prominent anterior osteophytes at C4  results in mild mass effect on the posterior wall of the cervical esophagus, however there is no evidence of stricture or functional obstruction. Esophagus normal.   Prior colonoscopy in 2015 with gastoenterologist at [facility] that showed diverticulosis.   No known family history of colon cancer or polyps. No family history of uterine/endometrial cancer, pancreatic cancer or gastric/stomach cancer.   Past Medical History:  Diagnosis Date  . Arthritis   . Carpal tunnel syndrome, bilateral   . Depression   . Depression   . Hypertension   . OSA (obstructive sleep apnea)   . UTI (lower urinary tract infection)     Past Surgical History:  Procedure Laterality Date  . CESAREAN SECTION     x 3  . CHOLECYSTECTOMY, LAPAROSCOPIC  2005    Current Outpatient Medications  Medication Sig Dispense Refill  . citalopram (CELEXA) 20 MG tablet 1 tab PO daily 30 tablet 2  . Coenzyme Q10 (CO Q-10) 100 MG CAPS Take by mouth.    . Cyanocobalamin (VITAMIN B-12) 50 MCG LOZG Take by mouth.    . cyclobenzaprine (FLEXERIL) 5 MG tablet Take 1 tablet (5 mg total) by mouth at bedtime as needed for muscle spasms. 15 tablet 0  . fexofenadine (ALLEGRA) 180 MG tablet Take 180 mg by mouth daily.    . furosemide (LASIX) 20 MG tablet Take 1 tablet (20 mg total) by mouth daily. 30 tablet 2  . GLUCOSAMINE HCL PO Take by mouth.    . Maca Root (MACA PO) Take by mouth.    . methylPREDNISolone (MEDROL DOSEPAK) 4 MG tablet follow package directions 21 tablet 0  . Multiple Vitamin (MULTIVITAMIN) tablet Take 1 tablet by mouth daily.    . naproxen sodium (ANAPROX) 220 MG tablet Take 440 mg by mouth 2 (two) times daily with a meal.    . Omega-3 Fatty Acids (FISH OIL PO) Take by mouth.    Marland Kitchen omeprazole (PRILOSEC) 20 MG capsule Take 1 capsule (20 mg total) by mouth daily. 30 capsule 3   No current facility-administered medications for this visit.    Allergies as of 02/25/2020 - Review Complete 09/14/2019  Allergen Reaction  Noted  . Macrodantin [nitrofurantoin macrocrystal] Itching 09/16/2011  . Morphine and related Itching 09/16/2011    Family History  Problem Relation Age of Onset  . Hypertension Mother   . Hyperlipidemia Mother   . Hypertension Father   . Heart attack Father   . Kidney Stones Brother   . Kidney Stones Son   . Cancer Maternal Aunt        breast  . Cancer Paternal Uncle        leukemia  . Kidney Stones Son   . Diabetes Neg Hx     Social History   Socioeconomic History  . Marital status: Single    Spouse name: Not on file  . Number of children: Not on file  . Years of education: Not on file  . Highest education level: Not on file  Occupational History  . Not on file  Tobacco Use  . Smoking status: Never Smoker  . Smokeless tobacco: Never Used  Substance and Sexual Activity  . Alcohol use: Yes    Comment: once a month (social)  . Drug use: No  .  Sexual activity: Never  Other Topics Concern  . 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   Reports Associates degree in nursing   Divorced         Social Determinants of Health   Financial Resource Strain:   . Difficulty of Paying Living Expenses: Not on file  Food Insecurity:   . Worried About Charity fundraiser in the Last Year: Not on file  . Ran Out of Food in the Last Year: Not on file  Transportation Needs:   . Lack of Transportation (Medical): Not on file  . Lack of Transportation (Non-Medical): Not on file  Physical Activity:   . Days of Exercise per Week: Not on file  . Minutes of Exercise per Session: Not on file  Stress:   . Feeling of Stress : Not on file  Social Connections:   . Frequency of Communication with Friends and Family: Not on file  . Frequency of Social Gatherings with Friends  and Family: Not on file  . Attends Religious Services: Not on file  . Active Member of Clubs or Organizations: Not on file  . Attends Archivist Meetings: Not on file  . Marital Status: Not on file  Intimate Partner Violence:   . Fear of Current or Ex-Partner: Not on file  . Emotionally Abused: Not on file  . Physically Abused: Not on file  . Sexually Abused: Not on file    Review of Systems: 12 system ROS is negative except as noted above with the addition of allergies, arthritis, back pain, vision changes, depression, fatigue, headaches, shortness of breath, insomnia, sore throat, voice changes. Also notes shortness of breath if she lies flat.   Physical Exam: General:   Alert, in NAD. No scleral icterus. No bilateral temporal wasting.  Heart:  Regular rate and rhythm; no murmurs Pulm: Clear anteriorly; no wheezing Abdomen:  Soft. Nontender. Nondistended. Normal bowel sounds. No rebound or guarding. No fluid wave.  LAD: No inguinal or umbilical LAD Extremities:  Without edema. Neurologic:  Alert and  oriented x4;  grossly normal neurologically; no asterixis or clonus. Skin: No jaundice. No palmar erythema or spider angioma.  Psych:  Alert and cooperative. Normal mood and affect.     Kari Spratlin L. Tarri Glenn, MD, MPH 02/25/2020, 8:49 AM

## 2020-02-25 NOTE — Patient Instructions (Addendum)
LABS: Your provider has requested that you go to the basement level for lab work before leaving today. Press "B" on the elevator. The lab is located at the first door on the left as you exit the elevator.  HEALTHCARE LAWS AND MY CHART RESULTS: Due to recent changes in healthcare laws, you may see the results of your imaging and laboratory studies on MyChart before your provider has had a chance to review them.  We understand that in some cases there may be results that are confusing or concerning to you. Not all laboratory results come back in the same time frame and the provider may be waiting for multiple results in order to interpret others.  Please give [place] 48 hours in order for your provider to thoroughly review all the results before contacting the office for clarification of your results.   You have been scheduled for an abdominal ultrasound at [facility] (1st floor of hospital) on 03/02/20 at 10am. Please arrive at 9:30am for registration. Make certain not to have anything to eat or drink after midnight. Should you need to reschedule your appointment, please contact radiology at [phone]. This test typically takes about 30 minutes to perform.  The records available to me show a prominent liver. But, there is no evidence for fatty liver. In addition, your liver enzymes are normal. However, you told me that a CT scan within the last 6 months showed fatty liver. If there is fatty liver, unfortunately, there are no FDA-approved medications for fatty liver disease.   We will obtain your previous colonoscopy and pathology reports from [place].  I don't recommend adjusting your medications without discussing this with your primary care provider. Metformin is a medication that we use for patient's with fatty liver. Please talk to Dr. Helene Kelp about if you should or shouldn't be taking this medication.  The good news is that the most effective treatment so far for fatty liver disease does not  involve medications, but rather lifestyle changes. The bad news is that these are typically hard to achieve and maintain for many people. Here's what we know helps:  - Lose weight. Weight loss of roughly 5% of your body weight might be enough to improve abnormal liver tests and decrease the fat in the liver. Losing between 7% and 10% of body weight seems to decrease the amount of inflammation and injury to liver cells, and it may even reverse some of the damage of fibrosis. Target a gradual weight loss of 1 to 2 pounds per week, as very rapid weight loss may worsen inflammation and fibrosis. You may want to explore the option of weight loss surgery with your doctor, if you aren't making any headway with weight loss and your health is suffering. - It appears that aerobic exercise also leads to decreased fat in the liver, and with vigorous intensity, possibly also decreased inflammation independent of weight loss. - Eat well. Some studies suggest that the Mediterranean diet may also decrease the fat in the liver. This nutrition plan emphasizes fruits, vegetables, whole grains, legumes, nuts, replacing butter with olive or canola oil, limiting red meat, and eating more fish and lean poultry. - Drink coffee, maybe? Some studies showed that patients with fatty liver who drank coffee (about two cups every day) had a decreased risk in fibrosis. However, take into consideration the downsides of regular caffeine intake.  Even though it can be difficult to make these lifestyle changes and lose the weight, the benefit is immense  if you have fatty liver, so give it your best effort! And remember, the greatest risk for people with a fatty liver is still cardiovascular disease. Not only can some of these lifestyle changes improve or resolve your fatty liver, they will also help keep your heart healthy.  [employer] is a great resource for all things related to the liver.   I would be happy to refer you to the liver  clinic here in [place] if you have additional questions. Please call the office to let us know if you would like [place] to send this referral.  If you are age 59 or younger, your body mass index should be between 19-25. Your Body mass index is 31.97 kg/m. If this is out of the aformentioned range listed, please consider follow up with your Primary Care Provider.   Thank you for trusting me with your gastrointestinal care!    Thornton Park, MD, MPH

## 2020-03-02 ENCOUNTER — Other Ambulatory Visit: Payer: Self-pay

## 2020-03-02 ENCOUNTER — Ambulatory Visit (HOSPITAL_COMMUNITY)
Admission: RE | Admit: 2020-03-02 | Discharge: 2020-03-02 | Disposition: A | Discharge status: Home / Self Care | Payer: Medicare Other | Source: Ambulatory Visit | Attending: Gastroenterology | Admitting: Gastroenterology

## 2020-03-02 DIAGNOSIS — R932 Abnormal findings on diagnostic imaging of liver and biliary tract: Secondary | ICD-10-CM | POA: Diagnosis not present

## 2020-03-02 DIAGNOSIS — K760 Fatty (change of) liver, not elsewhere classified: Secondary | ICD-10-CM | POA: Diagnosis not present

## 2020-03-03 ENCOUNTER — Other Ambulatory Visit: Payer: Self-pay

## 2020-03-03 DIAGNOSIS — K769 Liver disease, unspecified: Secondary | ICD-10-CM

## 2020-03-03 LAB — NASH FIBROSURE
ALPHA 2-MACROGLOBULINS, QN: 208 mg/dL (ref 110–276)
ALT (SGPT) P5P: 28 IU/L (ref 0–40)
AST (SGOT) P5P: 26 IU/L (ref 0–40)
Apolipoprotein A-1: 139 mg/dL (ref 116–209)
Bilirubin, Total: 0.1 mg/dL (ref 0.0–1.2)
Cholesterol, Total: 323 mg/dL — ABNORMAL HIGH (ref 100–199)
Fibrosis Score: 0.07 (ref 0.00–0.21)
GGT: 24 IU/L (ref 0–60)
Glucose: 115 mg/dL — ABNORMAL HIGH (ref 65–99)
Haptoglobin: 164 mg/dL (ref 33–346)
Height: 60 in
NASH Score: 0.5 — ABNORMAL HIGH
Steatosis Score: 0.73 — ABNORMAL HIGH (ref 0.00–0.30)
Triglycerides: 221 mg/dL — ABNORMAL HIGH (ref 0–149)
Weight: 167 [lb_av]

## 2020-03-14 ENCOUNTER — Ambulatory Visit (HOSPITAL_COMMUNITY)
Admission: RE | Admit: 2020-03-14 | Discharge: 2020-03-14 | Disposition: A | Discharge status: Home / Self Care | Payer: Medicare Other | Source: Ambulatory Visit | Attending: Gastroenterology | Admitting: Gastroenterology

## 2020-03-14 ENCOUNTER — Other Ambulatory Visit: Payer: Self-pay

## 2020-03-14 ENCOUNTER — Other Ambulatory Visit: Payer: Self-pay | Admitting: Gastroenterology

## 2020-03-14 DIAGNOSIS — K769 Liver disease, unspecified: Secondary | ICD-10-CM

## 2020-03-14 DIAGNOSIS — K8050 Calculus of bile duct without cholangitis or cholecystitis without obstruction: Secondary | ICD-10-CM | POA: Diagnosis not present

## 2020-03-14 MED ORDER — GADOBUTROL 1 MMOL/ML IV SOLN
7.0000 mL | Freq: Once | INTRAVENOUS | Status: AC | PRN
  Administered 2020-03-14: 7 mL via INTRAVENOUS

## 2020-03-16 ENCOUNTER — Ambulatory Visit: Payer: Medicare Other | Admitting: Gastroenterology

## 2020-03-23 ENCOUNTER — Telehealth: Payer: Self-pay | Admitting: Gastroenterology

## 2020-03-23 NOTE — Telephone Encounter (Signed)
Patient reports she is having back pain and is requesting pain medication.  She can't get into the her PCP until Monday.  She has been taking ibuprofen and a heating pad.  She is advised she could try an Urgent care if she can't get relief with OTC products, that Dr. Orvan Falconer does not prescribe pain medications.  I helped her find an Urgent care that does virtual visits.  She thanked me for the help.

## 2020-03-23 NOTE — Telephone Encounter (Signed)
Patient calling states she is having a lot of back pain and is seeking advise

## 2020-03-31 DIAGNOSIS — R1031 Right lower quadrant pain: Secondary | ICD-10-CM | POA: Diagnosis not present

## 2020-04-06 DIAGNOSIS — M4724 Other spondylosis with radiculopathy, thoracic region: Secondary | ICD-10-CM | POA: Diagnosis not present

## 2020-04-06 DIAGNOSIS — M5416 Radiculopathy, lumbar region: Secondary | ICD-10-CM | POA: Diagnosis not present

## 2020-04-06 DIAGNOSIS — M5126 Other intervertebral disc displacement, lumbar region: Secondary | ICD-10-CM | POA: Diagnosis not present

## 2020-04-17 ENCOUNTER — Ambulatory Visit (HOSPITAL_COMMUNITY)
Admission: EM | Admit: 2020-04-17 | Discharge: 2020-04-17 | Disposition: A | Discharge status: Home / Self Care | Payer: Medicare Other | Attending: Psychiatry | Admitting: Psychiatry

## 2020-04-17 ENCOUNTER — Encounter: Payer: Self-pay | Admitting: Gastroenterology

## 2020-04-17 ENCOUNTER — Ambulatory Visit (INDEPENDENT_AMBULATORY_CARE_PROVIDER_SITE_OTHER): Payer: Medicare Other | Admitting: Gastroenterology

## 2020-04-17 ENCOUNTER — Other Ambulatory Visit (INDEPENDENT_AMBULATORY_CARE_PROVIDER_SITE_OTHER): Payer: Medicare Other

## 2020-04-17 ENCOUNTER — Encounter (HOSPITAL_COMMUNITY): Payer: Self-pay

## 2020-04-17 ENCOUNTER — Other Ambulatory Visit: Payer: Self-pay

## 2020-04-17 VITALS — BP 124/70 | HR 82 | Ht 60.0 in | Wt 161.0 lb

## 2020-04-17 DIAGNOSIS — R935 Abnormal findings on diagnostic imaging of other abdominal regions, including retroperitoneum: Secondary | ICD-10-CM | POA: Diagnosis not present

## 2020-04-17 DIAGNOSIS — R197 Diarrhea, unspecified: Secondary | ICD-10-CM

## 2020-04-17 DIAGNOSIS — K760 Fatty (change of) liver, not elsewhere classified: Secondary | ICD-10-CM

## 2020-04-17 DIAGNOSIS — R1011 Right upper quadrant pain: Secondary | ICD-10-CM

## 2020-04-17 DIAGNOSIS — F332 Major depressive disorder, recurrent severe without psychotic features: Secondary | ICD-10-CM | POA: Diagnosis not present

## 2020-04-17 LAB — CBC WITH DIFFERENTIAL/PLATELET
Basophils Absolute: 0.1 10*3/uL (ref 0.0–0.1)
Basophils Relative: 0.9 % (ref 0.0–3.0)
Eosinophils Absolute: 0.1 10*3/uL (ref 0.0–0.7)
Eosinophils Relative: 1 % (ref 0.0–5.0)
HCT: 41.4 % (ref 36.0–46.0)
Hemoglobin: 14 g/dL (ref 12.0–15.0)
Lymphocytes Relative: 22.3 % (ref 12.0–46.0)
Lymphs Abs: 2 10*3/uL (ref 0.7–4.0)
MCHC: 33.8 g/dL (ref 30.0–36.0)
MCV: 87.5 fl (ref 78.0–100.0)
Monocytes Absolute: 0.6 10*3/uL (ref 0.1–1.0)
Monocytes Relative: 6.7 % (ref 3.0–12.0)
Neutro Abs: 6.2 10*3/uL (ref 1.4–7.7)
Neutrophils Relative %: 69.1 % (ref 43.0–77.0)
Platelets: 344 10*3/uL (ref 150.0–400.0)
RBC: 4.73 Mil/uL (ref 3.87–5.11)
RDW: 13.4 % (ref 11.5–15.5)
WBC: 9 10*3/uL (ref 4.0–10.5)

## 2020-04-17 LAB — COMPREHENSIVE METABOLIC PANEL
ALT: 27 U/L (ref 0–35)
AST: 22 U/L (ref 0–37)
Albumin: 4.6 g/dL (ref 3.5–5.2)
Alkaline Phosphatase: 64 U/L (ref 39–117)
BUN: 13 mg/dL (ref 6–23)
CO2: 26 mEq/L (ref 19–32)
Calcium: 9.6 mg/dL (ref 8.4–10.5)
Chloride: 105 mEq/L (ref 96–112)
Creatinine, Ser: 0.67 mg/dL (ref 0.40–1.20)
GFR: 95.73 mL/min (ref >60.00)
Glucose, Bld: 103 mg/dL — ABNORMAL HIGH (ref 70–99)
Potassium: 4.1 mEq/L (ref 3.5–5.1)
Sodium: 140 mEq/L (ref 135–145)
Total Bilirubin: 0.5 mg/dL (ref 0.2–1.2)
Total Protein: 7.3 g/dL (ref 6.0–8.3)

## 2020-04-17 LAB — LIPASE: Lipase: 30 U/L (ref 11.0–59.0)

## 2020-04-17 MED ORDER — DULOXETINE HCL 30 MG PO CPEP: 30.0000 mg | ORAL_CAPSULE | Freq: Every day | ORAL | 0 refills | Status: DC

## 2020-04-17 NOTE — BH Assessment (Signed)
Comprehensive Clinical Assessment (CCA) Note  04/17/2020 Kari [name] [id number]  Kari [name] is a 60 year old female presenting to [facility] voluntarily with chief complaint of worsening depression and suicidal ideation. Patient state "I am tired of wanting to die and having racing thoughts". Patient reports today she was thinking about ways to die which included hanging herself and shooting herself with a gun, so she decided to seek help. Patient reports chronic depression and suicidal ideation and has been on Celexa for the past six months which is being prescribed by her PCP. Patient does not have any mental health outpatient services and she has never had therapy.  Patient reports stressors related to health issues and pain, losing her place of living, and the death of her mother and dog. Patient reports moving in with her son after losing her house however, that did not work out so now she is living with her first husband mother.  Patient reports symptoms of depression, anxiety, low energy, crying, isolating, poor appetite, despondency, anhedonia, isolation and negative intrusive thoughts. 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 Patient reports the use of marijuana and state that she knows it has caused her some problems in her life. Patient denies any other substance use.  Patient is oriented to person, place and situation. Patient is engaged, alert and cooperative; her eye contact and tone of voice is normal. Patient presents tearful during assessment and depressed. Patient reports suicidal ideation with plan to hang herself or shoot herself. Patient states "I don't feel safe on my own". Patient provides protective factors of her three sons. Patient elaborates how she has unfriended all her friends on social media two years ago, has shut out all her supports other than her sons and roommate  and is very isolated. Patient denies having access to a gun and allows TTS to call her roommate Kari [name] [phone] to ensure someone will be at the house with her. Kari [name] reports that she is currently at the house and will be there until Wednesday when she works a 12-hour shift. Kari [name] reports that she able to provide patient with support when she arrives home.   Disposition: Per Marvia Pickles, NP, patient recommended for discharge and follow up with PHP services.        Chief Complaint:  Chief Complaint  Patient presents with  . Urgent Emergent Eval   Visit Diagnosis: MDD (major depressive disorder), recurrent severe, without psychosis ([place])    CCA Screening, Triage and Referral (STR)  Patient Reported Information How did you hear about [place]? Self (Phreesia 04/17/2020)  Referral name: [id number] Royden Purl 04/17/2020)  Referral phone number: No data recorded  Whom do you see for routine medical problems? Primary Care (Phreesia 04/17/2020)  Practice/Facility Name: [facility] ([place] 04/17/2020)  Practice/Facility Phone Number: No data recorded Name of Contact: Dr Helene Kelp ([place] 04/17/2020)  Contact Number: [phone] (Phreesia 04/17/2020)  Contact Fax Number: No data recorded Prescriber Name: Lenoria Farrier 04/17/2020)  Prescriber Address (if known): No data recorded  What Is the Reason for Your Visit/Call Today? I Want To Die Royden Purl 04/17/2020)  How Long Has This Been Causing You Problems? 1 wk - 1 month (Phreesia 04/17/2020)  What Do You Feel Would Help You the Most Today? Other (Comment) (Phreesia 04/17/2020)   Have You Recently Been in Any Inpatient Treatment (Hospital/Detox/Crisis Center/28-Day Program)? No (Phreesia 04/17/2020)  Name/Location of Program/Hospital:No data recorded How Long Were You There?  No data recorded When Were You Discharged? No data recorded  Have You Ever Received Services From [facility] Before? No (Phreesia 04/17/2020)  Who  Do You See at [facility]? No data recorded  Have You Recently Had Any Thoughts About Hurting Yourself? Yes (Phreesia 04/17/2020)  Are You Planning to Commit Suicide/Harm Yourself At This time? Yes (Phreesia 04/17/2020)   Have you Recently Had Thoughts About [employer]? No (Phreesia 04/17/2020)  Explanation: No data recorded  Have You Used Any Alcohol or Drugs in the Past 24 Hours? No (Phreesia 04/17/2020)  How Long Ago Did You Use Drugs or Alcohol? No data recorded What Did You Use and How Much? No data recorded  Do You Currently Have a Therapist/Psychiatrist? No (Phreesia 04/17/2020)  Name of Therapist/Psychiatrist: No data recorded  Have You Been Recently Discharged From Any Office Practice or Programs? No (Phreesia 04/17/2020)  Explanation of Discharge From Practice/Program: No data recorded    CCA Screening Triage Referral Assessment Type of Contact: Face-to-Face  Is this Initial or Reassessment? No data recorded Date Telepsych consult ordered in [facility]:  No data recorded Time Telepsych consult ordered in [facility]:  No data recorded  Patient Reported Information Reviewed? Yes  Patient Left Without Being Seen? No data recorded Reason for Not Completing Assessment: No data recorded  Collateral Involvement: none   Does Patient Have a [place]? No data recorded Name and Contact of Legal Guardian: No data recorded If Minor and Not Living with Parent(s), Who has Custody? No data recorded Is CPS involved or ever been involved? No data recorded Is APS involved or ever been involved? No data recorded  Patient Determined To Be At Risk for Harm To Self or Others Based on Review of Patient Reported Information or Presenting Complaint? Yes, for Self-Harm  Method: No data recorded Availability of Means: No data recorded Intent: No data recorded Notification Required: No data recorded Additional Information for Danger to Others Potential: No data  recorded Additional Comments for Danger to Others Potential: No data recorded Are There Guns or Other Weapons in Your Home? No data recorded Types of Guns/Weapons: No data recorded Are These Weapons Safely Secured?                            No data recorded Who Could Verify You Are Able To Have These Secured: No data recorded Do You Have any Outstanding Charges, Pending Court Dates, Parole/Probation? No data recorded Contacted To Inform of Risk of Harm To Self or Others: No data recorded  Location of Assessment: [facility]   Does Patient Present under Involuntary Commitment? No  IVC Papers Initial File Date: No data recorded  [place] of Residence: [place]   Patient Currently Receiving the Following Services: No data recorded  Determination of Need: No data recorded  Options For Referral: Partial Hospitalization     CCA Biopsychosocial Intake/Chief Complaint:  Depression and SI  Current Symptoms/Problems: Patient reports symptoms of depression, anxiety, low energy, crying, isolating, poor appetite, despondency, anhedonia, isolation and negative intrusive thoughts.   Patient Reported Schizophrenia/Schizoaffective Diagnosis in Past: No   Strengths: UTA  Preferences: UTA  Abilities: UTA   Type of Services Patient Feels are Needed: Medications and therapy   Initial Clinical Notes/Concerns: No data recorded  Mental Health Symptoms Depression:  Hopelessness; Increase/decrease in appetite; Irritability; Sleep (too much or little); Tearfulness; Worthlessness; Difficulty Concentrating; Change in energy/activity   Duration of Depressive  symptoms: Greater than two weeks   Mania:  N/A   Anxiety:   Worrying; Tension; Irritability   Psychosis:  None   Duration of Psychotic symptoms: No data recorded  Trauma:  N/A   Obsessions:  N/A   Compulsions:  N/A   Inattention:  N/A   Hyperactivity/Impulsivity:  N/A   Oppositional/Defiant Behaviors:  N/A    Emotional Irregularity:  N/A   Other Mood/Personality Symptoms:  No data recorded   Mental Status Exam Appearance and self-care  Stature:  Average   Weight:  Average weight   Clothing:  Age-appropriate; Neat/clean   Grooming:  Normal   Cosmetic use:  Age appropriate   Posture/gait:  Normal   Motor activity:  Not Remarkable   Sensorium  Attention:  Normal   Concentration:  Normal   Orientation:  X5   Recall/memory:  Normal   Affect and Mood  Affect:  Anxious; Depressed; Tearful   Mood:  Anxious; Depressed   Relating  Eye contact:  Normal   Facial expression:  Depressed; Tense   Attitude toward examiner:  Cooperative   Thought and Language  Speech flow: Clear and Coherent   Thought content:  Appropriate to Mood and Circumstances   Preoccupation:  None   Hallucinations:  None   Organization:  No data recorded  [employer] of Knowledge:  Good   Intelligence:  Average   Abstraction:  Normal   Judgement:  Fair   Reality Testing:  Adequate   Insight:  Fair   Decision Making:  Normal   Social Functioning  Social Maturity:  Isolates   Social Judgement:  Normal   Stress  Stressors:  Grief/losses; Housing; Illness   Coping Ability:  Overwhelmed; Exhausted   Skill Deficits:  None   Supports:  Friends/Service system; Family; Support needed     Religion:    Leisure/Recreation:    Exercise/Diet:     CCA Employment/Education Employment/Work Situation: Employment / Work Situation Employment situation: Unemployed What is the longest time patient has a held a job?: UTA 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  Education: Education Did Garment/textile technologist From [employer]?: Yes Did Theme park manager?: Yes   CCA Family/Childhood History Family and Relationship History: Family history Marital status: Divorced Divorced, when?: UTA What types of issues is patient dealing with in the relationship?:  UTA Additional relationship information: UTA What is your sexual orientation?: UTA Has your sexual activity been affected by drugs, alcohol, medication, or emotional stress?: UTA Does patient have children?: Yes How many children?: 3  Childhood History:  Childhood History Additional childhood history information: UTA Description of patient's relationship with caregiver when they were a child: UTA Patient's description of current relationship with people who raised him/her: UTA How were you disciplined when you got in trouble as a child/adolescent?: UTA  Child/Adolescent Assessment:     CCA Substance Use Alcohol/Drug Use: Alcohol / Drug Use Pain Medications: See MAR Prescriptions: See MAR Over the Counter: See MAR History of alcohol / drug use?: Yes Substance #1 Name of Substance 1: Cannabis 1 - Duration: ongoing                       ASAM's:  Six Dimensions of Multidimensional Assessment  Dimension 1:  Acute Intoxication and/or Withdrawal Potential:      Dimension 2:  Biomedical Conditions and Complications:      Dimension 3:  Emotional, Behavioral, or Cognitive Conditions and Complications:  Dimension 4:  Readiness to Change:     Dimension 5:  Relapse, Continued use, or Continued Problem Potential:     Dimension 6:  Recovery/Living Environment:     ASAM Severity Score:    ASAM Recommended Level of Treatment:     Substance use Disorder (SUD)    Recommendations for Services/Supports/Treatments: Recommendations for Services/Supports/Treatments Recommendations For Services/Supports/Treatments: Medication Management,Individual Therapy  DSM5 Diagnoses: Patient Active Problem List   Diagnosis Date Noted  . OSA (obstructive sleep apnea) 08/31/2013  . Fatigue 08/13/2013  . Low back pain 08/13/2013  . Osteoarthritis 08/13/2013  . Depression 08/13/2013  . Atypical chest pain 08/13/2013   Disposition: Per Marvia Pickles, NP, patient recommended for  discharge and follow up with PHP services.        [place], [facility]

## 2020-04-17 NOTE — Progress Notes (Signed)
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 Plan EGD and colonoscopy for further evaluation.  RUQ/Right flank pain: Not explained by CT 01/2020 or ultrasound and MRI. Unsure if this is GI in origin. Will proceed with endoscopic evaluation.    Fatty liver without fibrosis: Reviewed NASH Fibrosure and elastography results which made fibrosis unlikely. Briefly discussed the diagnosis, natural history, and treatment. Honestly, she feels too unwell today to focus on preventative efforts. She expressed interest in meeting with a nutritionist.   Hepatic hemangioma: Reviewed benign findings from ultrasound and CT. It is too small to be the cause of her RUQ/right flank pain.   Pancreatic cyst: Reviewed MRI findings. Plan repeat MRI in one year.   Multiple complaints that do not sound consistent with GI disease: Patient notes symptoms are likely due to poorly treated depression. Has not seen a psychiatrist in years. Dr. Helene Kelp was managing her treatment, but, she hasn't seen Dr. Helene Kelp in a while and won't be seeing her new [facility] until next month. I asked her to go to the [facility] today. She agreed to go after have her labs drawn.    PLAN: Stool for GI pathogen panel, fecal calprotectin CMP, lipase, CBC with diff Covid-19  testing EGD to evaluate abdominal pain not explained by cross-sectional imaging Colonoscopy with random colon biopsies MRI in 1 year to follow-up on the pancreatic cyst Establish care with PCP Nutrition consultation at her request Follow-up in 4 weeks, earlier as necessary  Please see the "Patient Instructions" section for addition details about the plan.  HPI: Kari [name] is a 60 y.o. female who returns in follow-up after her self-referred consultation for fatty liver 02/25/20.   She brings several concerns to her visit today:  (1) Concerns for fatty liver as reviewed at consultation visit.  CT of the abdomen and pelvis without contrast from [facility] ED 02/05/20 showed a liver measuring 20.4 cm and colonic diverticulosis. Liver enzymes and lipase were normal.   (2) Progressive severe fatigue and back pain. Since her last visit, her symptoms are worsened to the point that she can barely get out of bed. Sleeping nearly all of the time.  Notes the symptoms are temporally related with a dramatic progression in her depression.   (3) Change in bowel habits. Since her last visit here, she has only had 3 formed bowel movements since Christmas. Stools are poorly formed described as smooth mush. Still RUQ pain that she had before, but finds that it is most severe in the upper abdomen.  Was seen at the [facility] in [place] Jan 2nd or 3rd for the pain, fever, and diarrhea, given cipro for probable colitis and on a prednisone dose pak for her back.(finished this) She skipped the last 2 doses. She was asked to submit stool samples but she  accidentally through them off.  No improvement in symptoms with Cipro.  Some improvement with PeptoBismal. She was afraid to take it more because she thought it had aspirin.  (4) Severe RUQ/Right flank pain.  Feels this is directly related to her diarrhea. Evaluate with CTAP without contrast, normal liver enzymes, normal lipase, normal CT in  [place] ED 11/21.  (5) History of pelvic pain previously evaluation at [facility] in 2018. This is not an active concern today. Office notes at that time document intermittent pain since 2013. Pain described as a lower back that wraps around to the front with associated bloating.  In 2017 had a CT scan and was told cysts on kidneys and ovaries as well as liver hemangioma. Transvaginal ultrasound 2018 showed a small anterior myoma and small left cyst.   (6) History of oropharyngeal dysphagia evaluated by [facility] 03/2018. History of pelvic pain previously evaluation at [facility] in 2018. Office notes at that time document intermittent pain since 2013. Pain described as a lower back that wraps around to the front with associated bloating.  In 2017 had a CT scan and was told cysts on kidneys and ovaries as well as liver hemangioma. Transvaginal ultrasound 2018 showed a small anterior myoma and small left cyst.     Since her last visit she had: - Ultrasound with elastography 03/02/20: fatty liver, 2.2 x 2.2 x 2.6 cm hypoechoic lesion in the inferior right liver. median kPa 3.6 c/w high probability of being normal - MRI 03/14/2020 shows a 2.5 cm benign hemangioma in the right hepatic lobe corresponding to the findings on ultrasound, prior cholecystectomy without biliary ductal dilatation, and a 5 mm cystic lesion in the pancreatic body - [facility] 03/23/2020 shows F0S3 (marked or severe steatosis, no fibrosis, for a Nash grade of an 1  She has not reviewed any of these symptoms with Dr. Helene Kelp. She is switching her health care to [facility] and has an appointment to establish primary care in February.    In addition to her ongoing symptoms, I obtained her prior records from Dr. Melina Copa.  I reviewed records from Dr. Melina Copa today showing a colonoscopy report from 11/22/13 with Dr. Melina Copa showed pancolonic diverticulosis, continue Bentyl PRN for LUQ pain. She confirmed these findings but notes that she rarely took it  because she didn't feel she needed it.   No known family history of colon cancer or polyps. No family history of uterine/endometrial cancer, pancreatic cancer or gastric/stomach cancer.   Past Medical History:  Diagnosis Date  . Arthritis   . 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 Carpal tunnel syndrome, bilateral   . Colitis   . Depression   . Hypertension   . OSA (obstructive sleep apnea)   . UTI (lower urinary tract infection)     Past Surgical History:  Procedure Laterality Date  . CERVICAL FUSION    . CESAREAN SECTION     x 3  . CHOLECYSTECTOMY, LAPAROSCOPIC  2005    Current Outpatient Medications  Medication Sig Dispense Refill  . citalopram (CELEXA) 20 MG tablet 1 tab PO daily 30 tablet 2  . gabapentin (NEURONTIN) 600 MG tablet Take 1,200 mg by mouth 2 (two) times daily.    . meloxicam (MOBIC) 15 MG tablet Take 15 mg by mouth daily.    . metFORMIN (GLUCOPHAGE) 1000 MG tablet 1,000 mg two times a day.     No current facility-administered medications for this visit.  Allergies as of 04/17/2020 - Review Complete 04/17/2020  Allergen Reaction Noted  . Macrodantin [nitrofurantoin macrocrystal] Itching 09/16/2011  . Morphine and related Itching 09/16/2011  . Other  04/17/2020    Family History  Problem Relation Age of Onset  . Hypertension Mother   . Hyperlipidemia Mother   . Dementia Mother   . Diabetes Mother   . Hypertension Father   . Heart attack Father   . Kidney Stones Brother   . Kidney Stones Son   . Breast cancer Maternal Aunt   . Leukemia Paternal Uncle   . Kidney Stones Son     Social History   Socioeconomic History  . Marital status: Single    Spouse name: Not on file  . Number of children: 3  . Years of education: Not on file  . Highest education level: Not on file  Occupational History  . Occupation: retired Therapist, sports  Tobacco Use  . Smoking status: Never Smoker  . Smokeless tobacco: Never Used  Vaping Use  .  Vaping Use: Never used  Substance and Sexual Activity  . Alcohol use: Never  . Drug use: No  . Sexual activity: Never  Other Topics Concern  . Not on file  Social History Narrative   Lives with her first husband's mother   Reports 3 grown sons, oldest son's live in [place]   Has worked as a travel Film/video editor son lives in [place] with his dad- Electronics engineer   Works as a Systems analyst.   Reports Associates degree in nursing   Divorced         Social Determinants of Health   Financial Resource Strain: Not on file  Food Insecurity: Not on file  Transportation Needs: Not on file  Physical Activity: Not on file  Stress: Not on file  Social Connections: Not on file  Intimate Partner Violence: Not on file    Physical Exam: General:   Alert, in NAD. No scleral icterus. No bilateral temporal wasting. Lying on the examination bed through most of the visit.  Heart:  Regular rate and rhythm; no murmurs Pulm: Clear anteriorly; no wheezing Abdomen:  Soft. Tender in the RUQ and right flank, although I am unable to reproduce her symptoms on exam. Nondistended. Normal bowel sounds. No rebound or guarding. No fluid wave.  LAD: No inguinal or umbilical LAD Extremities:  Without edema. Neurologic:  Alert and  oriented x4;  grossly normal neurologically; no asterixis or clonus. Skin: No jaundice. No palmar erythema or spider angioma.  Psych:  Alert and cooperative. Normal mood and affect.     Aser Nylund L. Tarri Glenn, MD, MPH 04/17/2020, 8:35 AM

## 2020-04-17 NOTE — ED Provider Notes (Signed)
Behavioral Health Urgent Care Medical Screening Exam  Patient Name: Kari [name] MRN: [id number] Date of Evaluation: 04/17/20 Chief Complaint:   Diagnosis:  Final diagnoses:  MDD (major depressive disorder), recurrent severe, without psychosis ([facility])    History of Present illness: Kari [name] is a 60 y.o. female.  Patient presents as a walk-in to the [facility] voluntarily reporting worsening depression with suicidal ideations.  Patient first reports that she has been having thoughts of hanging herself or shooting herself but then states that she can never do that because of her 3 adult children.  She continues report that she has been on a antidepressant for the last 25 years.  She states that she has never had any therapy and does not see a psychiatrist in approximately 20+ years.  She reports that she has been on Prozac, Celexa, Lexapro, and Neurontin.  She states she takes Neurontin mainly to assist with her chronic back pain.  She states that things are gotten worse over the last month.  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  She states that after that she started losing her housing and everything else that she had and had to move in with her youngest son.  She stated that she felt that she was a burden on her youngest son and then moved out from there and moved in with her ex-husband's mother, nanny, who she claims is her best friend.  She states that the only issue with living there is [place] and [place] very [place].  She reports that she still currently takes Celexa 20 mg p.o. daily and this is prescribed to her by her PCP.  She states that she really wants to get in with therapy and a provider with psychiatry.  Later the patient states that after having lengthy talk about her stressors as well as options she does not feel suicidal and feels much better.  She states that she is  still interested in changing her medications and after discussing medications further the patient has agreed to start Cymbalta 30 mg p.o. daily with a plan to titrate up.  Patient reports that even though she does not have much support that her best friend will be home tonight and that she does feel safe at home with her.  She states that she is interested in some immediate treatment however, so the patient has agreed to be referred to [facility].  Social work is been contacted and the patient is referred to the [facility] program.  Patient is instructed to continue her Neurontin as prescribed and to discontinue her Celexa.  Patient is informed to start the Cymbalta at 30 mg p.o. daily and the medication has been E prescribed to pharmacy of choice.  Patient continues to report that even though she has had thoughts in the past and wanting to harm herself that she can never do that because she cannot do that to her children.  She also reports that she has no intent of harming herself and does not want to die.  Patient is requesting to discharge home with prescription and to follow-up with PHP and states that if she starts to feel worse she will return.  Patient's best friend, nanny, was contacted by TTS staff and she states that she does feel safe with the patient discharging home and that she will be there tonight to be with the  patient.  Psychiatric Specialty Exam  Presentation  General Appearance:Appropriate for Environment; Casual; Fairly Groomed  Eye Contact:Good  Speech:Clear and Coherent; Normal Rate  Speech Volume:Normal  Handedness:Right   Mood and Affect  Mood:Depressed  Affect:Depressed; Congruent; Appropriate   Thought Process  Thought Processes:Coherent  Descriptions of Associations:Intact  Orientation:Full (Time, Place and Person)  Thought Content:WDL  Hallucinations:None  Ideas of Reference:None  Suicidal Thoughts:No  Homicidal Thoughts:No   Sensorium  Memory:Immediate Good;  Recent Good; Remote Good  Judgment:Good  Insight:Good   Executive Functions  Concentration:Good  Attention Span:Good  [place]  Language:Good   Psychomotor Activity  Psychomotor Activity:Normal   Assets  Assets:Communication Skills; Desire for Improvement; Financial Resources/Insurance; Web designer; Social Support; Physical Health   Sleep  Sleep:Good  Number of hours: No data recorded  Physical Exam: Physical Exam Vitals and nursing note reviewed.  Constitutional:      Appearance: She is well-developed.  HENT:     Head: Normocephalic.  Eyes:     Pupils: Pupils are equal, round, and reactive to light.  Cardiovascular:     Rate and Rhythm: Normal rate.  Pulmonary:     Effort: Pulmonary effort is normal.  Musculoskeletal:        General: Normal range of motion.  Neurological:     Mental Status: She is alert and oriented to person, place, and time.    Review of Systems  Constitutional: Negative.   HENT: Negative.   Eyes: Negative.   Respiratory: Negative.   Cardiovascular: Negative.   Gastrointestinal: Negative.   Genitourinary: Negative.   Musculoskeletal: Negative.   Skin: Negative.   Neurological: Negative.   Endo/Heme/Allergies: Negative.   Psychiatric/Behavioral: Positive for depression.   Blood pressure (!) 154/97, pulse 62, temperature 98.6 F (37 C), temperature source Oral, height 5' (1.524 m), weight 158 lb (71.7 kg), last menstrual period 12/17/2010, SpO2 97 %. Body mass index is 30.86 kg/m.  Musculoskeletal: Strength & Muscle Tone: within normal limits Gait & Station: normal Patient leans: N/A   [facility] Discharge Disposition for Follow up and Recommendations: Based on my evaluation the patient does not appear to have an emergency medical condition and can be discharged with resources and follow up care in outpatient services for Medication Management, Partial Hospitalization Program and  [place], FNP 04/17/2020, 1:35 PM

## 2020-04-17 NOTE — Patient Instructions (Addendum)
LABS: I have recommended some labs. Please go to the basement level for lab work before leaving today. Press "B" on the elevator. The lab is located at the first door on the left as you exit the elevator.  HEALTHCARE LAWS AND MY CHART RESULTS: Due to recent changes in healthcare laws, you may see the results of your imaging and laboratory studies on MyChart before your provider has had a chance to review them.  We understand that in some cases there may be results that are confusing or concerning to you. Not all laboratory results come back in the same time frame and the provider may be waiting for multiple results in order to interpret others.  Please give [place] 48 hours in order for your provider to thoroughly review all the results before contacting the office for clarification of your results.   COVID TESTING:  I would like for you to have Covid testing completed. You will need to schedule an appointment by logging on to [email or web address].  NEW PRIMARY CARE PROVIDER:   I am hopeful that establishing with a new primary care provider with better treatment for possible bipolar will make a difference in all of symptoms.  [facility]:   After your labs today, please go to the [facility] at [address] in [place], [place]. We know that the brain and gut are closely connected. I'm not sure your GI symptoms are going to improve until your depression is better.   RECOMMENDATIONS:   Other things you can do for the diarrhea: - Drink a lot of liquids that have water, salt, and sugar. Good choices are water mixed with juice, flavored soda, and soup broth. If you are drinking enough, your urine will be light yellow or almost clear. - Try to eat a little food. Good choices are potatoes, noodles, rice, oatmeal, crackers, bananas, soup, and boiled vegetables. - Avoid high fat foods, as they can make diarrhea worse.  - Dairy products (except yogurt) may be  difficult to digest when you have diarrhea. I recommend that you temporarily avoid lactose-containing foods.  - You could try bismuth salicylate (Pepto-Bismol) 30 mL or two tablets every 30 minutes for eight doses. Pepto-Bismol may make your stools black. - A daily probiotic may be very helpful. - Please go the Urgent Care with any worrisome escalation in your symptoms before your next appointment.  - We will plan an Endoscopy and Colonoscopy after you have established with your new PCP and have improved management of your Bipolar condition. - We will plan to repeat your MRI in one year  FOLLOW UP APPOINTMENT:   Let's plan to check in with another visit after the stool and blood results are available. Your appointment has been scheduled for May 16, 2020 @ 11:50am. If you are unable to attend this appointment, please call [phone] to reschedule.  If you are age 60 or younger, your body mass index should be between 19-25. Your There is no height or weight on file to calculate BMI. If this is out of the aformentioned range listed, please consider follow up with your Primary Care Provider.   Thank you for trusting me with your gastrointestinal care!    [place], MD, MPH

## 2020-04-17 NOTE — Progress Notes (Signed)
Received Kari [name] at the [facility] alone and tearful. She wants help to stop feeling suicidal which is a combination of stressors. She has been depressed x 2 years, isolative, divorced and living with a room mate. She has been thinking about hanging herself which is now new for her to have a plan. She has been on anti depressants x 25 years from her PCP. She has sleep apnea.

## 2020-04-17 NOTE — Discharge Instructions (Signed)

## 2020-04-17 NOTE — Progress Notes (Signed)
Kari [name] received her AVS, questions answered and she was escorted to retrieve her personal belongings.

## 2020-04-18 ENCOUNTER — Telehealth (HOSPITAL_COMMUNITY): Payer: Self-pay | Admitting: Family Medicine

## 2020-04-18 ENCOUNTER — Other Ambulatory Visit: Payer: Medicare Other

## 2020-04-18 DIAGNOSIS — R935 Abnormal findings on diagnostic imaging of other abdominal regions, including retroperitoneum: Secondary | ICD-10-CM

## 2020-04-18 DIAGNOSIS — M5126 Other intervertebral disc displacement, lumbar region: Secondary | ICD-10-CM | POA: Diagnosis not present

## 2020-04-18 DIAGNOSIS — K760 Fatty (change of) liver, not elsewhere classified: Secondary | ICD-10-CM | POA: Diagnosis not present

## 2020-04-18 DIAGNOSIS — R197 Diarrhea, unspecified: Secondary | ICD-10-CM | POA: Diagnosis not present

## 2020-04-18 DIAGNOSIS — R1011 Right upper quadrant pain: Secondary | ICD-10-CM

## 2020-04-18 DIAGNOSIS — M4724 Other spondylosis with radiculopathy, thoracic region: Secondary | ICD-10-CM | POA: Diagnosis not present

## 2020-04-18 NOTE — Telephone Encounter (Signed)
Care Management - Follow Up [facility] Discharges   Writer attempted to make contact with patient today and was unsuccessful.  Writer was able to leave a HIPPA compliant voice message and will await callback.   

## 2020-04-19 ENCOUNTER — Telehealth (HOSPITAL_COMMUNITY): Payer: Self-pay | Admitting: Professional

## 2020-04-22 LAB — GI PROFILE, STOOL, PCR

## 2020-04-23 DIAGNOSIS — R7303 Prediabetes: Secondary | ICD-10-CM | POA: Diagnosis not present

## 2020-04-23 DIAGNOSIS — I10 Essential (primary) hypertension: Secondary | ICD-10-CM | POA: Diagnosis not present

## 2020-04-24 LAB — CALPROTECTIN: Calprotectin: 8 mcg/g

## 2020-04-27 ENCOUNTER — Other Ambulatory Visit: Payer: Self-pay

## 2020-04-27 ENCOUNTER — Encounter: Payer: Self-pay | Admitting: Pulmonary Disease

## 2020-04-27 ENCOUNTER — Ambulatory Visit (INDEPENDENT_AMBULATORY_CARE_PROVIDER_SITE_OTHER): Payer: Medicare Other | Admitting: Pulmonary Disease

## 2020-04-27 VITALS — BP 138/82 | HR 75 | Temp 98.1°F | Ht 60.0 in | Wt 161.4 lb

## 2020-04-27 DIAGNOSIS — G4733 Obstructive sleep apnea (adult) (pediatric): Secondary | ICD-10-CM

## 2020-04-27 NOTE — Patient Instructions (Signed)
Contact [facility]  .  Smart card for machine .  CPAP supplies-full facemask .  Copy of sleep study  I will follow up with you in about 3 months  .  Gradually bring down the pressure from 15, you can decrease the pressure over 2 to 3 weeks at a time down  to a pressure where you feel you are still getting enough air and you are not waking up like your sleep is nonrestorative  Call with any significant concerns

## 2020-04-27 NOTE — Progress Notes (Signed)
Kari [name]    [id number]    08/23/1960  Primary Care Physician:Holt, Gara Kroner, MD  Referring Physician: Ronita Hipps, MD [place] [id number],   Chief complaint:   History of obstructive sleep apnea  HPI:  Diagnosed with obstructive sleep apnea many years ago Current machine is from 2017 Initially was started on the pressure of 9  With the recall of the machines, started using an inline filter She gradually did increase the pressures up to 15  Wakes up with a dry mouth  Usually goes to bed about 7 PM Falls asleep pretty quickly, takes gabapentin 2-3 awakenings Final wake up time about 3 AM  Weight has been up and down, generally she is up about 20 pounds but she is working on losing this weight  Sinus obstructive sleep apnea Usually feels restored in the morning when she wakes up from a CPAP Does have morning headaches  History of hypertension-well-controlled, history of diabetes-controlled.  History of allergies  Never smoker  Worked in nursing  Outpatient Encounter Medications as of 04/27/2020  Medication Sig  . co-enzyme Q-10 30 MG capsule Take 30 mg by mouth daily.  . DULoxetine (CYMBALTA) 30 MG capsule Take 1 capsule (30 mg total) by mouth daily.  Marland Kitchen gabapentin (NEURONTIN) 600 MG tablet Take 1,200 mg by mouth 2 (two) times daily.  . meloxicam (MOBIC) 15 MG tablet Take 15 mg by mouth daily.  . metFORMIN (GLUCOPHAGE) 1000 MG tablet 1,000 mg two times a day.  . Multiple Vitamin (MULTIVITAMIN) tablet Take 1 tablet by mouth daily.  Marland Kitchen OVER THE COUNTER MEDICATION Kratom 500 mg capsules two capsules three times a day  . vitamin E 1000 UNIT capsule Take 1,000 Units by mouth daily.   No facility-administered encounter medications on file as of 04/27/2020.    Allergies as of 04/27/2020 - Review Complete 04/27/2020  Allergen Reaction Noted  . Macrodantin [nitrofurantoin macrocrystal] Itching 09/16/2011  . Morphine  and related Itching 09/16/2011  . Other  04/17/2020    Past Medical History:  Diagnosis Date  . Arthritis   . 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 Carpal tunnel syndrome, bilateral   . Colitis   . Depression   . Hypertension   . OSA (obstructive sleep apnea)   . UTI (lower urinary tract infection)     Past Surgical History:  Procedure Laterality Date  . CERVICAL FUSION    . CESAREAN SECTION     x 3  . CHOLECYSTECTOMY, LAPAROSCOPIC  2005    Family History  Problem Relation Age of Onset  . Hypertension Mother   . Hyperlipidemia Mother   . Dementia Mother   . Diabetes Mother   . Hypertension Father   . Heart attack Father   . Kidney Stones Brother   . Kidney Stones Son   . Breast cancer Maternal Aunt   . Leukemia Paternal Uncle   . Kidney Stones Son     Social History   Socioeconomic History  . Marital status: Divorced    Spouse name: Not on file  . Number of children: 3  . Years of education: Not on file  . Highest education level: Associate degree: academic program  Occupational History  . Occupation: retired Therapist, sports  Tobacco Use  . Smoking status: Never Smoker  . Smokeless tobacco: Never Used  Vaping Use  . Vaping Use: Never used  Substance  and Sexual Activity  . Alcohol use: Not Currently    Comment: none in 2 years  . Drug use: Not Currently    Types: Marijuana  . Sexual activity: Not Currently  Other Topics Concern  . Not on file  Social History Narrative   Lives with her first husband's mother   Reports 3 grown sons, oldest son's live in [place]   Has worked as a travel Film/video editor son lives in [place] with his dad- Electronics engineer   Works as a Systems analyst.   Reports Associates degree in nursing   Divorced         Social Determinants of Health   Financial Resource Strain: Not on file  Food Insecurity: Not on file  Transportation Needs: Not on file  Physical Activity: Not on file  Stress: Not on file   Social Connections: Not on file  Intimate Partner Violence: Not on file    Review of Systems  Constitutional: Negative for fatigue.  Respiratory: Positive for apnea. Negative for shortness of breath.   Psychiatric/Behavioral: Positive for sleep disturbance.    Vitals:   04/27/20 0939  BP: 138/82  Pulse: 75  Temp: 98.1 F (36.7 C)  SpO2: 96%     Physical Exam Constitutional:      Appearance: She is obese.  HENT:     Head: Normocephalic.     Mouth/Throat:     Mouth: Mucous membranes are moist.     Comments: Mallampati 3, crowded oropharynx Cardiovascular:     Rate and Rhythm: Normal rate and regular rhythm.     Heart sounds: No murmur heard. No friction rub.  Pulmonary:     Effort: No respiratory distress.     Breath sounds: No stridor. No wheezing or rhonchi.  Musculoskeletal:     Cervical back: No rigidity or tenderness.  Neurological:     Mental Status: She is alert.  Psychiatric:        Mood and Affect: Mood normal.    Results of the Epworth flowsheet 04/27/2020  Sitting and reading 1  Watching TV 1  Sitting, inactive in a public place (e.g. a theatre or a meeting) 0  As a passenger in a car for an hour without a break 0  Lying down to rest in the afternoon when circumstances permit 2  Sitting and talking to someone 0  Sitting quietly after a lunch without alcohol 0  In a car, while stopped for a few minutes in traffic 0  Total score 4    Data Reviewed: Previous study not available  Assessment:  History of obstructive sleep apnea  Very good compliance with CPAP therapy -Current machine does not have a smart card in it -Has gradually gone up on the pressure from a initial pressure about 9 to about pressure of 15 when she started using an inline filter with it with admission recall  Dryness -Uses humidification  CPAP supplies  Plan/Recommendations: Contact medical supply company for .  CPAP supplies-wants to try a full facemask .  Procuring a  smart card to be placed into machine .  Copy of previous sleep study  She did sign up for replacement of her machine  Continue using CPAP  Gradually bring pressure back down and monitor symptoms  Encouraged to call with any significant concerns  Continue weight loss efforts  Tentative follow-up in 3 months   Sherrilyn Rist MD [facility] 04/27/2020, 9:43 AM  CC: Ronita Hipps,  MD   

## 2020-05-01 DIAGNOSIS — Z23 Encounter for immunization: Secondary | ICD-10-CM | POA: Diagnosis not present

## 2020-05-16 ENCOUNTER — Ambulatory Visit: Payer: Medicare Other | Admitting: Gastroenterology

## 2020-05-17 ENCOUNTER — Encounter: Payer: Self-pay | Admitting: Internal Medicine

## 2020-05-17 ENCOUNTER — Ambulatory Visit (INDEPENDENT_AMBULATORY_CARE_PROVIDER_SITE_OTHER): Payer: Medicare Other | Admitting: Internal Medicine

## 2020-05-17 ENCOUNTER — Other Ambulatory Visit: Payer: Self-pay

## 2020-05-17 VITALS — BP 126/90 | HR 78 | Temp 98.6°F | Resp 18 | Ht 60.0 in | Wt 161.0 lb

## 2020-05-17 DIAGNOSIS — R7301 Impaired fasting glucose: Secondary | ICD-10-CM | POA: Diagnosis not present

## 2020-05-17 DIAGNOSIS — E782 Mixed hyperlipidemia: Secondary | ICD-10-CM | POA: Diagnosis not present

## 2020-05-17 DIAGNOSIS — M5441 Lumbago with sciatica, right side: Secondary | ICD-10-CM

## 2020-05-17 DIAGNOSIS — E118 Type 2 diabetes mellitus with unspecified complications: Secondary | ICD-10-CM | POA: Diagnosis not present

## 2020-05-17 DIAGNOSIS — F324 Major depressive disorder, single episode, in partial remission: Secondary | ICD-10-CM | POA: Diagnosis not present

## 2020-05-17 DIAGNOSIS — G8929 Other chronic pain: Secondary | ICD-10-CM | POA: Diagnosis not present

## 2020-05-17 DIAGNOSIS — E785 Hyperlipidemia, unspecified: Secondary | ICD-10-CM | POA: Diagnosis not present

## 2020-05-17 DIAGNOSIS — M5440 Lumbago with sciatica, unspecified side: Secondary | ICD-10-CM

## 2020-05-17 LAB — LIPID PANEL
Cholesterol: 260 mg/dL — ABNORMAL HIGH (ref 0–200)
HDL: 54.9 mg/dL (ref >39.00)
NonHDL: 204.71
Total CHOL/HDL Ratio: 5
Triglycerides: 238 mg/dL — ABNORMAL HIGH (ref 0.0–149.0)
VLDL: 47.6 mg/dL — ABNORMAL HIGH (ref 0.0–40.0)

## 2020-05-17 LAB — HEMOGLOBIN A1C: Hgb A1c MFr Bld: 6 % (ref 4.6–6.5)

## 2020-05-17 LAB — LDL CHOLESTEROL, DIRECT: Direct LDL: 176 mg/dL

## 2020-05-17 MED ORDER — DULOXETINE HCL 30 MG PO CPEP: 30.0000 mg | ORAL_CAPSULE | Freq: Every day | ORAL | 3 refills | Status: DC

## 2020-05-17 MED ORDER — ZOSTER VAC RECOMB ADJUVANTED 50 MCG/0.5ML IM SUSR: 0.5000 mL | Freq: Once | INTRAMUSCULAR | 1 refills | Status: AC

## 2020-05-17 NOTE — Progress Notes (Unsigned)
   Subjective:   Patient ID: Kari [name], female    DOB: Sep 04, 1960, 60 y.o.   MRN: [id number]  HPI The patient is a new patient coming in for continuing care of her depression (taking cymbalta and overall satisfied with level of control, denies SI/HI), and diabetes (previously diagnosed, on metformin for some time, denies recent HgA1c, denies neuropathy but does have tingling in the legs due to low back problems), and cholesterol (taking zetia, no recent lipid panel).   PMH, [facility], social history reviewed and updated  Review of Systems  Constitutional: Negative.   HENT: Negative.   Eyes: Negative.   Respiratory: Negative for cough, chest tightness and shortness of breath.   Cardiovascular: Negative for chest pain, palpitations and leg swelling.  Gastrointestinal: Negative for abdominal distention, abdominal pain, constipation, diarrhea, nausea and vomiting.  Musculoskeletal: Positive for back pain and myalgias.  Skin: Negative.   Neurological: Negative.   Psychiatric/Behavioral: Negative.     Objective:  Physical Exam Constitutional:      Appearance: She is well-developed and well-nourished.  HENT:     Head: Normocephalic and atraumatic.  Eyes:     Extraocular Movements: EOM normal.  Cardiovascular:     Rate and Rhythm: Normal rate and regular rhythm.  Pulmonary:     Effort: Pulmonary effort is normal. No respiratory distress.     Breath sounds: Normal breath sounds. No wheezing or rales.  Abdominal:     General: Bowel sounds are normal. There is no distension.     Palpations: Abdomen is soft.     Tenderness: There is no abdominal tenderness. There is no rebound.  Musculoskeletal:        General: Tenderness present. No edema.     Cervical back: Normal range of motion.  Skin:    General: Skin is warm and dry.  Neurological:     Mental Status: She is alert and oriented to person, place, and time.     Coordination: Coordination normal.  Psychiatric:         Mood and Affect: Mood and affect normal.     Vitals:   05/17/20 0854  BP: 126/90  Pulse: 78  Resp: 18  Temp: 98.6 F (37 C)  TempSrc: Oral  SpO2: 96%  Weight: 161 lb (73 kg)  Height: 5' (1.524 m)    This visit occurred during the SARS-CoV-2 public health emergency.  Safety protocols were in place, including screening questions prior to the visit, additional usage of staff PPE, and extensive cleaning of exam room while observing appropriate contact time as indicated for disinfecting solutions.   Assessment & Plan:  Visit time 40 minutes in face to face communication with patient and coordination of care, additional 10 minutes spent in record review, coordination or care, ordering tests, communicating/referring to other healthcare professionals, documenting in medical records all on the same day of the visit for total time 50 minutes spent on the visit.

## 2020-05-17 NOTE — Patient Instructions (Addendum)
We will check the labs today and call you back about the results.    Health Maintenance, Female Adopting a healthy lifestyle and getting preventive care are important in promoting health and wellness. Ask your health care provider about:  The right schedule for you to have regular tests and exams.  Things you can do on your own to prevent diseases and keep yourself healthy. What should I know about diet, weight, and exercise? Eat a healthy diet  Eat a diet that includes plenty of vegetables, fruits, low-fat dairy products, and lean protein.  Do not eat a lot of foods that are high in solid fats, added sugars, or sodium.   Maintain a healthy weight Body mass index (BMI) is used to identify weight problems. It estimates body fat based on height and weight. Your health care provider can help determine your BMI and help you achieve or maintain a healthy weight. Get regular exercise Get regular exercise. This is one of the most important things you can do for your health. Most adults should:  Exercise for at least 150 minutes each week. The exercise should increase your heart rate and make you sweat (moderate-intensity exercise).  Do strengthening exercises at least twice a week. This is in addition to the moderate-intensity exercise.  Spend less time sitting. Even light physical activity can be beneficial. Watch cholesterol and blood lipids Have your blood tested for lipids and cholesterol at 60 years of age, then have this test every 5 years. Have your cholesterol levels checked more often if:  Your lipid or cholesterol levels are high.  You are older than 60 years of age.  You are at high risk for heart disease. What should I know about cancer screening? Depending on your health history and family history, you may need to have cancer screening at various ages. This may include screening for:  Breast cancer.  Cervical cancer.  Colorectal cancer.  Skin cancer.  Lung  cancer. What should I know about heart disease, diabetes, and high blood pressure? Blood pressure and heart disease  High blood pressure causes heart disease and increases the risk of stroke. This is more likely to develop in people who have high blood pressure readings, are of African descent, or are overweight.  Have your blood pressure checked: ? Every 3-5 years if you are 48-7 years of age. ? Every year if you are 69 years old or older. Diabetes Have regular diabetes screenings. This checks your fasting blood sugar level. Have the screening done:  Once every three years after age 38 if you are at a normal weight and have a low risk for diabetes.  More often and at a younger age if you are overweight or have a high risk for diabetes. What should I know about preventing infection? Hepatitis B If you have a higher risk for hepatitis B, you should be screened for this virus. Talk with your health care provider to find out if you are at risk for hepatitis B infection. Hepatitis C Testing is recommended for:  Everyone born from [birth year] through 1965.  Anyone with known risk factors for hepatitis C. Sexually transmitted infections (STIs)  Get screened for STIs, including gonorrhea and chlamydia, if: ? You are sexually active and are younger than 61 years of age. ? You are older than 60 years of age and your health care provider tells you that you are at risk for this type of infection. ? Your sexual activity has changed since you were last  screened, and you are at increased risk for chlamydia or gonorrhea. Ask your health care provider if you are at risk.  Ask your health care provider about whether you are at high risk for HIV. Your health care provider may recommend a prescription medicine to help prevent HIV infection. If you choose to take medicine to prevent HIV, you should first get tested for HIV. You should then be tested every 3 months for as long as you are taking the  medicine. Pregnancy  If you are about to stop having your period (premenopausal) and you may become pregnant, seek counseling before you get pregnant.  Take 400 to 800 micrograms (mcg) of folic acid every day if you become pregnant.  Ask for birth control (contraception) if you want to prevent pregnancy. Osteoporosis and menopause Osteoporosis is a disease in which the bones lose minerals and strength with aging. This can result in bone fractures. If you are 74 years old or older, or if you are at risk for osteoporosis and fractures, ask your health care provider if you should:  Be screened for bone loss.  Take a calcium or vitamin D supplement to lower your risk of fractures.  Be given hormone replacement therapy (HRT) to treat symptoms of menopause. Follow these instructions at home: Lifestyle  Do not use any products that contain nicotine or tobacco, such as cigarettes, e-cigarettes, and chewing tobacco. If you need help quitting, ask your health care provider.  Do not use street drugs.  Do not share needles.  Ask your health care provider for help if you need support or information about quitting drugs. Alcohol use  Do not drink alcohol if: ? Your health care provider tells you not to drink. ? You are pregnant, may be pregnant, or are planning to become pregnant.  If you drink alcohol: ? Limit how much you use to 0-1 drink a day. ? Limit intake if you are breastfeeding.  Be aware of how much alcohol is in your drink. In the [place] one drink equals one 12 oz bottle of beer (355 mL), one 5 oz glass of wine (148 mL), or one 1 oz glass of hard liquor (44 mL). General instructions  Schedule regular health, dental, and eye exams.  Stay current with your vaccines.  Tell your health care provider if: ? You often feel depressed. ? You have ever been abused or do not feel safe at home. Summary  Adopting a healthy lifestyle and getting preventive care are important in  promoting health and wellness.  Follow your health care provider's instructions about healthy diet, exercising, and getting tested or screened for diseases.  Follow your health care provider's instructions on monitoring your cholesterol and blood pressure. This information is not intended to replace advice given to you by your health care provider. Make sure you discuss any questions you have with your health care provider. Document Revised: 03/04/2018 Document Reviewed: 03/04/2018 Elsevier Patient Education  2021 [employer].

## 2020-05-19 DIAGNOSIS — E1169 Type 2 diabetes mellitus with other specified complication: Secondary | ICD-10-CM

## 2020-05-19 DIAGNOSIS — E118 Type 2 diabetes mellitus with unspecified complications: Secondary | ICD-10-CM

## 2020-05-19 DIAGNOSIS — E785 Hyperlipidemia, unspecified: Secondary | ICD-10-CM

## 2020-05-19 HISTORY — DX: Hyperlipidemia, unspecified: E78.5

## 2020-05-19 HISTORY — DX: Type 2 diabetes mellitus with unspecified complications: E11.8

## 2020-05-19 HISTORY — DX: Type 2 diabetes mellitus with other specified complication: E11.69

## 2020-05-19 NOTE — Assessment & Plan Note (Signed)
Taking zetia. Checking lipid panel and adjust as needed.

## 2020-05-19 NOTE — Assessment & Plan Note (Signed)
Taking cymbalta and overall she is satisfied with level of control.

## 2020-05-19 NOTE — Assessment & Plan Note (Signed)
Taking metformin and it is unclear from patient if she had pre-diabetes and is metformin for prevention or diabetes. Will need to obtain records and clarify if needed. Checking HgA1c and go from there.

## 2020-05-19 NOTE — Assessment & Plan Note (Signed)
Overall stable, taking cymbalta. Has had many problems with this for years.

## 2020-07-28 NOTE — Progress Notes (Deleted)
    Subjective:    CC: Bilateral wrist and hand pain  I, Kari [name], LAT, ATC, am serving as scribe for Dr. Lynne Leader.  HPI: Pt is a 60 y/o female presenting w/ B wrist/hand pain x .  She locates her pain to .  Radiating pain: Numbness/tingling: Aggravating factors: Treatments tried:  Pertinent review of Systems: ***  Relevant historical information: ***   Objective:   There were no vitals filed for this visit. General: Well Developed, well nourished, and in no acute distress.   MSK: ***  Lab and Radiology Results No results found for this or any previous visit (from the past 72 hour(s)). No results found.    Impression and Recommendations:    Assessment and Plan: 60 y.o. female with ***.  PDMP not reviewed this encounter. No orders of the defined types were placed in this encounter.  No orders of the defined types were placed in this encounter.   Discussed warning signs or symptoms. Please see discharge instructions. Patient expresses understanding.   ***

## 2020-07-31 ENCOUNTER — Ambulatory Visit: Payer: Medicare Other | Admitting: Family Medicine

## 2020-08-28 ENCOUNTER — Other Ambulatory Visit: Payer: Self-pay

## 2020-08-28 ENCOUNTER — Encounter: Payer: Self-pay | Admitting: Internal Medicine

## 2020-08-28 ENCOUNTER — Ambulatory Visit (INDEPENDENT_AMBULATORY_CARE_PROVIDER_SITE_OTHER): Payer: Medicare Other | Admitting: Internal Medicine

## 2020-08-28 VITALS — BP 132/88 | HR 85 | Temp 98.5°F | Resp 18 | Ht 60.0 in | Wt 156.2 lb

## 2020-08-28 DIAGNOSIS — E1169 Type 2 diabetes mellitus with other specified complication: Secondary | ICD-10-CM

## 2020-08-28 DIAGNOSIS — E118 Type 2 diabetes mellitus with unspecified complications: Secondary | ICD-10-CM

## 2020-08-28 DIAGNOSIS — G8929 Other chronic pain: Secondary | ICD-10-CM | POA: Diagnosis not present

## 2020-08-28 DIAGNOSIS — G44209 Tension-type headache, unspecified, not intractable: Secondary | ICD-10-CM | POA: Diagnosis not present

## 2020-08-28 DIAGNOSIS — M5440 Lumbago with sciatica, unspecified side: Secondary | ICD-10-CM | POA: Diagnosis not present

## 2020-08-28 DIAGNOSIS — E785 Hyperlipidemia, unspecified: Secondary | ICD-10-CM

## 2020-08-28 DIAGNOSIS — F331 Major depressive disorder, recurrent, moderate: Secondary | ICD-10-CM

## 2020-08-28 LAB — HEMOGLOBIN A1C: Hgb A1c MFr Bld: 6.1 % (ref 4.6–6.5)

## 2020-08-28 MED ORDER — DULOXETINE HCL 60 MG PO CPEP: 60.0000 mg | ORAL_CAPSULE | Freq: Every day | ORAL | 3 refills | Status: DC

## 2020-08-28 MED ORDER — BLOOD GLUCOSE MONITORING SUPPL W/DEVICE KIT: PACK | 0 refills | Status: DC

## 2020-08-28 MED ORDER — METFORMIN HCL 1000 MG PO TABS: 1000.0000 mg | ORAL_TABLET | Freq: Two times a day (BID) | ORAL | 3 refills | Status: DC

## 2020-08-28 MED ORDER — GABAPENTIN 600 MG PO TABS: 900.0000 mg | ORAL_TABLET | Freq: Every day | ORAL | 5 refills | Status: DC

## 2020-08-28 MED ORDER — EZETIMIBE 10 MG PO TABS: 10.0000 mg | ORAL_TABLET | Freq: Every day | ORAL | 3 refills | Status: DC

## 2020-08-28 MED ORDER — MELOXICAM 15 MG PO TABS: 15.0000 mg | ORAL_TABLET | Freq: Every day | ORAL | 3 refills | Status: AC

## 2020-08-28 NOTE — Progress Notes (Signed)
   Subjective:   Patient ID: Kari [name], female    DOB: 02-May-1960, 60 y.o.   MRN: [id number]  HPI The patient is a 60 YO female coming in for wanting to increase cymbalta (started her on low dose and she feels it is helping, wants to try increase) and sugars (taking metformin and worried she is getting resistant to it, sugar this morning 120 without any major dietary changes), and headache (taking tylenol and this is helping some, needs refill on gabapentin and meloxicam which are helpful).   Review of Systems  Constitutional:  Positive for activity change. Negative for appetite change, chills, fatigue, fever and unexpected weight change.  HENT: Negative.    Eyes: Negative.   Respiratory: Negative.  Negative for cough, chest tightness and shortness of breath.   Cardiovascular: Negative.  Negative for chest pain, palpitations and leg swelling.  Gastrointestinal: Negative.  Negative for abdominal distention, abdominal pain, constipation, diarrhea, nausea and vomiting.  Musculoskeletal:  Positive for arthralgias, back pain and myalgias. Negative for gait problem and joint swelling.  Skin: Negative.   Neurological: Negative.   Psychiatric/Behavioral:  Positive for decreased concentration and dysphoric mood.    Objective:  Physical Exam Constitutional:      Appearance: She is well-developed.  HENT:     Head: Normocephalic and atraumatic.  Cardiovascular:     Rate and Rhythm: Normal rate and regular rhythm.  Pulmonary:     Effort: Pulmonary effort is normal. No respiratory distress.     Breath sounds: Normal breath sounds. No wheezing or rales.  Abdominal:     General: Bowel sounds are normal. There is no distension.     Palpations: Abdomen is soft.     Tenderness: There is no abdominal tenderness. There is no rebound.  Musculoskeletal:        General: Tenderness present.     Cervical back: Normal range of motion.  Skin:    General: Skin is warm and dry.   Neurological:     Mental Status: She is alert and oriented to person, place, and time.     Coordination: Coordination normal.    Vitals:   08/28/20 1009  BP: 132/88  Pulse: 85  Resp: 18  Temp: 98.5 F (36.9 C)  TempSrc: Oral  SpO2: 96%  Weight: 156 lb 3.2 oz (70.9 kg)  Height: 5' (1.524 m)    This visit occurred during the SARS-CoV-2 public health emergency.  Safety protocols were in place, including screening questions prior to the visit, additional usage of staff PPE, and extensive cleaning of exam room while observing appropriate contact time as indicated for disinfecting solutions.   Assessment & Plan:

## 2020-08-28 NOTE — Patient Instructions (Addendum)
We have sent in the metformin refill and will go up on the dose of the cymbalta to 60 mg daily (take 2 pills daily of the 30 mg until they are gone).   We are checking the HgA1c today to see where the sugars are up.  We have sent in the refills.

## 2020-08-31 DIAGNOSIS — R519 Headache, unspecified: Secondary | ICD-10-CM

## 2020-08-31 HISTORY — DX: Headache, unspecified: R51.9

## 2020-08-31 NOTE — Assessment & Plan Note (Signed)
Moderate and need increase of cymbalta to 60 mg daily. Reassess in 3-4 weeks.

## 2020-08-31 NOTE — Assessment & Plan Note (Signed)
Okay to continue tylenol and refilled meloxicam.

## 2020-08-31 NOTE — Assessment & Plan Note (Signed)
Taking zetia and does not want statin.

## 2020-08-31 NOTE — Assessment & Plan Note (Addendum)
Refill gabapentin and meloxicam. Will increase cymbalta to 60 mg daily to help more with pain.

## 2020-08-31 NOTE — Assessment & Plan Note (Signed)
Checking HgA1c as it has been 3 months and she is seeing higher sugar readings. Taking metformin 1000 mg BID and let me know that this is maximum dose so if increasing we could add another medication or make other changes.

## 2020-11-28 ENCOUNTER — Ambulatory Visit: Payer: Medicare Other | Admitting: Internal Medicine

## 2020-12-15 ENCOUNTER — Encounter: Payer: Self-pay | Admitting: Pulmonary Disease

## 2020-12-15 ENCOUNTER — Telehealth: Payer: Medicare Other | Admitting: Adult Health

## 2020-12-15 ENCOUNTER — Other Ambulatory Visit: Payer: Self-pay

## 2020-12-15 ENCOUNTER — Ambulatory Visit (INDEPENDENT_AMBULATORY_CARE_PROVIDER_SITE_OTHER): Payer: Medicare Other | Admitting: Pulmonary Disease

## 2020-12-15 VITALS — BP 150/88 | HR 83 | Temp 98.7°F | Ht 60.0 in | Wt 155.6 lb

## 2020-12-15 DIAGNOSIS — G4733 Obstructive sleep apnea (adult) (pediatric): Secondary | ICD-10-CM | POA: Diagnosis not present

## 2020-12-15 NOTE — Patient Instructions (Signed)
Order full night polysomnogram  Referral to Dr. Jonette Eva -Evaluation for an inspire device  I will see you back in 3 months  Call with significant concerns

## 2020-12-15 NOTE — Progress Notes (Signed)
Kari [name]    [id number]    [birth year]  Primary Care Physician:Crawford, Real Cons, MD  Referring Physician: Hoyt Koch, MD [address] [place],  [place] [address]  Chief complaint:   History of obstructive sleep apnea  HPI:  Diagnosed with obstructive sleep apnea many years ago Current machine is from 2017 Initially was started on the pressure of 9 has had to gradually increase the pressure to about 15  Machine does not appear to be working well Not tolerating CPAP well currently  Did have questions about inspire device and has been doing some research on it She would like to be evaluated for it  Last study was about 2015 Weight has been stable  Usually goes to bed about 7 PM Falls asleep pretty quickly, takes gabapentin 2-3 awakenings Final wake up time about 3 AM  Weight has been up and down, generally she is up about 20 pounds but she is working on losing this weight  History of hypertension-well-controlled, history of diabetes-controlled.  History of allergies  Never smoker  Worked in nursing  Outpatient Encounter Medications as of 12/15/2020  Medication Sig   B Complex Vitamins (B COMPLEX PO) Take 1 tablet by mouth daily.   Blood Glucose Monitoring Suppl w/Device KIT Use daily to check sugars   Cholecalciferol (VITAMIN D3) 50 MCG (2000 UT) capsule Take 2,000 Units by mouth daily.   co-enzyme Q-10 30 MG capsule Take 30 mg by mouth daily.   DULoxetine (CYMBALTA) 60 MG capsule Take 1 capsule (60 mg total) by mouth daily.   ezetimibe (ZETIA) 10 MG tablet Take 1 tablet (10 mg total) by mouth daily.   gabapentin (NEURONTIN) 600 MG tablet Take 1.5 tablets (900 mg total) by mouth at bedtime.   meloxicam (MOBIC) 15 MG tablet Take 1 tablet (15 mg total) by mouth daily.   metFORMIN (GLUCOPHAGE) 1000 MG tablet Take 1 tablet (1,000 mg total) by mouth 2 (two) times daily with a meal.   Multiple Vitamin (MULTIVITAMIN) tablet  Take 1 tablet by mouth daily.   OVER THE COUNTER MEDICATION Kratom 500 mg capsules two capsules three times a day   vitamin E 1000 UNIT capsule Take 1,000 Units by mouth daily.   No facility-administered encounter medications on file as of 12/15/2020.    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   Carpal tunnel syndrome, bilateral    Colitis    Depression    Hypertension    OSA (obstructive sleep apnea)    UTI (lower urinary tract infection)     Past Surgical History:  Procedure Laterality Date   CERVICAL FUSION     CESAREAN SECTION     x 3   CHOLECYSTECTOMY, LAPAROSCOPIC  2005    Family History  Problem Relation Age of Onset   Hypertension Mother    Hyperlipidemia Mother    Dementia Mother    Diabetes Mother    Hypertension Father    Heart attack Father    Kidney Stones Brother    Kidney Stones Son    Breast cancer Maternal Aunt    Leukemia Paternal Uncle    Kidney Stones Son  Social History   Socioeconomic History   Marital status: Divorced    Spouse name: Not on file   Number of children: 3   Years of education: Not on file   Highest education level: Associate degree: academic program  Occupational History   Occupation: retired Therapist, sports  Tobacco Use   Smoking status: Never   Smokeless tobacco: Never  Vaping Use   Vaping Use: Never used  Substance and Sexual Activity   Alcohol use: Not Currently    Comment: none in 2 years   Drug use: Not Currently    Types: Marijuana   Sexual activity: Not Currently  Other Topics Concern   Not on file  Social History Narrative   Lives with her first husband's mother   Reports 3 grown sons, oldest son's live in [place]   Has worked as a travel Scientist, 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   Reports Associates degree in nursing   Divorced         Social Determinants of Health   Financial Resource Strain: Not on file  Food Insecurity: Not on file  Transportation Needs: Not on file  Physical Activity: Not on file  Stress: Not on file  Social Connections: Not on file  Intimate Partner Violence: Not on file    Review of Systems  Constitutional:  Negative for fatigue.  Respiratory:  Positive for apnea. Negative for shortness of breath.   Psychiatric/Behavioral:  Positive for sleep disturbance.    Vitals:   12/15/20 1555  BP: (!) 150/88  Pulse: 83  Temp: 98.7 F (37.1 C)  SpO2: 97%     Physical Exam Constitutional:      Appearance: She is obese.  HENT:     Head: Normocephalic.     Mouth/Throat:     Mouth: Mucous membranes are moist.     Comments: Mallampati 3, crowded oropharynx Cardiovascular:     Rate and Rhythm: Normal rate and regular rhythm.     Heart sounds: No murmur heard.   No friction rub.  Pulmonary:     Effort: No respiratory distress.     Breath sounds: No stridor. No wheezing or rhonchi.  Musculoskeletal:     Cervical back: No rigidity or tenderness.  Neurological:     Mental Status: She is alert.  Psychiatric:        Mood and Affect: Mood normal.   Results of the Epworth flowsheet 04/27/2020  Sitting and reading 1  Watching TV 1  Sitting, inactive in a public place (e.g. a theatre or a meeting) 0  As a passenger in a car for an hour without a break 0  Lying down to rest in the afternoon when circumstances permit 2  Sitting and talking to someone 0  Sitting quietly after a lunch without alcohol 0  In a car, while stopped for a few minutes in traffic 0  Total score 4    Data Reviewed: Previous study not available  Assessment:  History of obstructive sleep apnea  Compliant with CPAP but feels its not working well Intolerance to  CPAP  We did talk about other options of treatment including an inspire device and desires to be evaluated for 1  We will schedule her for an in lab sleep study to evaluate for severity of obstructive sleep apnea and qualify for CPAP therapy or other options of treatment  Plan/Recommendations:  Order in lab polysomnogram.  Continue using current machine at present  Encouraged to call with any significant concerns  Tentative follow-up in about 3 months   Sherrilyn Rist MD [facility] 12/15/2020, 4:18 PM  CC: Hoyt Koch, *

## 2020-12-21 DIAGNOSIS — E119 Type 2 diabetes mellitus without complications: Secondary | ICD-10-CM | POA: Diagnosis not present

## 2020-12-21 DIAGNOSIS — H524 Presbyopia: Secondary | ICD-10-CM | POA: Diagnosis not present

## 2020-12-21 DIAGNOSIS — H25813 Combined forms of age-related cataract, bilateral: Secondary | ICD-10-CM | POA: Diagnosis not present

## 2020-12-29 ENCOUNTER — Encounter: Payer: Self-pay | Admitting: Internal Medicine

## 2020-12-29 ENCOUNTER — Ambulatory Visit (INDEPENDENT_AMBULATORY_CARE_PROVIDER_SITE_OTHER): Payer: Medicare Other | Admitting: Internal Medicine

## 2020-12-29 ENCOUNTER — Other Ambulatory Visit: Payer: Self-pay

## 2020-12-29 VITALS — BP 126/76 | HR 76 | Resp 18 | Ht 60.0 in | Wt 158.6 lb

## 2020-12-29 DIAGNOSIS — K869 Disease of pancreas, unspecified: Secondary | ICD-10-CM

## 2020-12-29 DIAGNOSIS — M159 Polyosteoarthritis, unspecified: Secondary | ICD-10-CM | POA: Diagnosis not present

## 2020-12-29 DIAGNOSIS — F331 Major depressive disorder, recurrent, moderate: Secondary | ICD-10-CM

## 2020-12-29 DIAGNOSIS — E118 Type 2 diabetes mellitus with unspecified complications: Secondary | ICD-10-CM

## 2020-12-29 DIAGNOSIS — M150 Primary generalized (osteo)arthritis: Secondary | ICD-10-CM

## 2020-12-29 HISTORY — DX: Disease of pancreas, unspecified: K86.9

## 2020-12-29 LAB — HEMOGLOBIN A1C: Hgb A1c MFr Bld: 6.1 % (ref 4.6–6.5)

## 2020-12-29 LAB — COMPREHENSIVE METABOLIC PANEL
ALT: 10 U/L (ref 0–35)
AST: 11 U/L (ref 0–37)
Albumin: 4.6 g/dL (ref 3.5–5.2)
Alkaline Phosphatase: 65 U/L (ref 39–117)
BUN: 12 mg/dL (ref 6–23)
CO2: 27 mEq/L (ref 19–32)
Calcium: 9.3 mg/dL (ref 8.4–10.5)
Chloride: 104 mEq/L (ref 96–112)
Creatinine, Ser: 0.66 mg/dL (ref 0.40–1.20)
GFR: 95.6 mL/min (ref >60.00)
Glucose, Bld: 94 mg/dL (ref 70–99)
Potassium: 3.9 mEq/L (ref 3.5–5.1)
Sodium: 140 mEq/L (ref 135–145)
Total Bilirubin: 0.4 mg/dL (ref 0.2–1.2)
Total Protein: 7.4 g/dL (ref 6.0–8.3)

## 2020-12-29 LAB — CBC
HCT: 40.8 % (ref 36.0–46.0)
Hemoglobin: 13.6 g/dL (ref 12.0–15.0)
MCHC: 33.3 g/dL (ref 30.0–36.0)
MCV: 86.8 fl (ref 78.0–100.0)
Platelets: 324 10*3/uL (ref 150.0–400.0)
RBC: 4.7 Mil/uL (ref 3.87–5.11)
RDW: 14 % (ref 11.5–15.5)
WBC: 7.1 10*3/uL (ref 4.0–10.5)

## 2020-12-29 LAB — LIPASE: Lipase: 151 U/L — ABNORMAL HIGH (ref 11.0–59.0)

## 2020-12-29 MED ORDER — CITALOPRAM HYDROBROMIDE 20 MG PO TABS
20.0000 mg | ORAL_TABLET | Freq: Every day | ORAL | 3 refills | Status: DC
Start: 2020-12-29 — End: 2021-04-24

## 2020-12-29 NOTE — Patient Instructions (Signed)
We will check the labs today.  We will switch you back to celexa. You can stop cymbalta and start celexa. Do not take them both on the same day.

## 2020-12-29 NOTE — Assessment & Plan Note (Signed)
She does not feel that increase in cymbalta to 60 mg was effective and wishes to return to prior celexa. Will rx celexa 20 mg daily and stop cymbalta 60 mg daily. Follow up 1-2 months for efficacy and may need to adjust dosing.

## 2020-12-29 NOTE — Assessment & Plan Note (Addendum)
Seeing GI next week and will need follow up MRI Dec 2022. We did discuss imaging results from Dec 2021. Checking lipase and CMP today.

## 2020-12-29 NOTE — Assessment & Plan Note (Signed)
Checking HgA1c today and adjust as needed her metformin 1000 mg BID. She was previously at goal.

## 2020-12-29 NOTE — Assessment & Plan Note (Signed)
Checking CBC and CMP as she is on meloxicam to ensure no signs of occult bleeding or renal changes.

## 2020-12-29 NOTE — Progress Notes (Signed)
   Subjective:   Patient ID: Kari [name], female    DOB: May 25, 1960, 60 y.o.   MRN: [id number]  HPI The patient is a 60 YO female coming in for follow up medical conditions.   Review of Systems  Constitutional: Negative.   HENT: Negative.    Eyes: Negative.   Respiratory:  Negative for cough, chest tightness and shortness of breath.   Cardiovascular:  Negative for chest pain, palpitations and leg swelling.  Gastrointestinal:  Negative for abdominal distention, abdominal pain, constipation, diarrhea, nausea and vomiting.  Musculoskeletal: Negative.   Skin: Negative.   Neurological: Negative.   Psychiatric/Behavioral:  Positive for decreased concentration and dysphoric mood.    Objective:  Physical Exam Constitutional:      Appearance: She is well-developed.  HENT:     Head: Normocephalic and atraumatic.  Cardiovascular:     Rate and Rhythm: Normal rate and regular rhythm.  Pulmonary:     Effort: Pulmonary effort is normal. No respiratory distress.     Breath sounds: Normal breath sounds. No wheezing or rales.  Abdominal:     General: Bowel sounds are normal. There is no distension.     Palpations: Abdomen is soft.     Tenderness: There is no abdominal tenderness. There is no rebound.  Musculoskeletal:     Cervical back: Normal range of motion.  Skin:    General: Skin is warm and dry.  Neurological:     Mental Status: She is alert and oriented to person, place, and time.     Coordination: Coordination normal.    Vitals:   12/29/20 0856  BP: 126/76  Pulse: 76  Resp: 18  SpO2: 98%  Weight: 158 lb 9.6 oz (71.9 kg)  Height: 5' (1.524 m)    This visit occurred during the SARS-CoV-2 public health emergency.  Safety protocols were in place, including screening questions prior to the visit, additional usage of staff PPE, and extensive cleaning of exam room while observing appropriate contact time as indicated for disinfecting solutions.   Assessment &  Plan:

## 2021-01-01 ENCOUNTER — Telehealth: Payer: Self-pay | Admitting: Gastroenterology

## 2021-01-01 DIAGNOSIS — R748 Abnormal levels of other serum enzymes: Secondary | ICD-10-CM

## 2021-01-01 NOTE — Telephone Encounter (Signed)
Returned pt call. Advised we have put her on a move up list. In addition, advised to repeat labs PRIOR to upcoming appt. If she develops any N/V, abd pain, changes in bowel habits or fever, may need to proceed with ED for eval and tx't. Verbalized acceptance and understanding.

## 2021-01-01 NOTE — Telephone Encounter (Signed)
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 Sharlet Salina 12/29/20 and had Lipase, CBC, CMP and HgbA1C completed.  Those NOT completed per original recommendations by Dr. Tarri Glenn in January 2022, stool studies, EGD/Colon. Repeat MRI will not be due until Dec 2022.  Pt canceled f/u appt 2/22 and has an upcoming appt with Dr. Tarri Glenn on 01/12/21. Routing this message to Dr. Tarri Glenn to determine if she would like stool studies completed BEFORE upcoming appt. Will likely need to defer management of elevated lipase to PCP until pt can be seen by GI, as well as any GI recommendations re: elevated lipase until upcoming appt with Dr. Tarri Glenn.

## 2021-01-01 NOTE — Telephone Encounter (Signed)
Inbound call from pt requesting a call back stating that her Lipase is high. She's feeling dizzy and she is having abd pain. Please advise. Thank you.

## 2021-01-05 ENCOUNTER — Other Ambulatory Visit (INDEPENDENT_AMBULATORY_CARE_PROVIDER_SITE_OTHER): Payer: Medicare Other

## 2021-01-05 DIAGNOSIS — R748 Abnormal levels of other serum enzymes: Secondary | ICD-10-CM

## 2021-01-05 LAB — LIPASE: Lipase: 64 U/L — ABNORMAL HIGH (ref 11.0–59.0)

## 2021-01-09 ENCOUNTER — Other Ambulatory Visit (HOSPITAL_BASED_OUTPATIENT_CLINIC_OR_DEPARTMENT_OTHER): Payer: Self-pay | Admitting: Internal Medicine

## 2021-01-09 DIAGNOSIS — Z1231 Encounter for screening mammogram for malignant neoplasm of breast: Secondary | ICD-10-CM

## 2021-01-10 ENCOUNTER — Other Ambulatory Visit: Payer: Self-pay

## 2021-01-10 DIAGNOSIS — R197 Diarrhea, unspecified: Secondary | ICD-10-CM

## 2021-01-10 DIAGNOSIS — K769 Liver disease, unspecified: Secondary | ICD-10-CM

## 2021-01-10 DIAGNOSIS — R1011 Right upper quadrant pain: Secondary | ICD-10-CM

## 2021-01-10 DIAGNOSIS — K862 Cyst of pancreas: Secondary | ICD-10-CM

## 2021-01-10 DIAGNOSIS — K760 Fatty (change of) liver, not elsewhere classified: Secondary | ICD-10-CM

## 2021-01-10 DIAGNOSIS — R935 Abnormal findings on diagnostic imaging of other abdominal regions, including retroperitoneum: Secondary | ICD-10-CM

## 2021-01-10 DIAGNOSIS — R748 Abnormal levels of other serum enzymes: Secondary | ICD-10-CM

## 2021-01-10 DIAGNOSIS — D1803 Hemangioma of intra-abdominal structures: Secondary | ICD-10-CM

## 2021-01-12 ENCOUNTER — Encounter: Payer: Self-pay | Admitting: Gastroenterology

## 2021-01-12 ENCOUNTER — Ambulatory Visit (INDEPENDENT_AMBULATORY_CARE_PROVIDER_SITE_OTHER): Payer: Medicare Other | Admitting: Gastroenterology

## 2021-01-12 VITALS — BP 130/84 | HR 92 | Ht 60.0 in | Wt 159.2 lb

## 2021-01-12 DIAGNOSIS — R935 Abnormal findings on diagnostic imaging of other abdominal regions, including retroperitoneum: Secondary | ICD-10-CM

## 2021-01-12 DIAGNOSIS — R748 Abnormal levels of other serum enzymes: Secondary | ICD-10-CM

## 2021-01-12 DIAGNOSIS — R1011 Right upper quadrant pain: Secondary | ICD-10-CM

## 2021-01-12 DIAGNOSIS — G5600 Carpal tunnel syndrome, unspecified upper limb: Secondary | ICD-10-CM

## 2021-01-12 HISTORY — DX: Carpal tunnel syndrome, unspecified upper limb: G56.00

## 2021-01-12 NOTE — Progress Notes (Signed)
Referring Provider: Hoyt Koch, * Primary Care Physician:  Hoyt Koch, MD  Chief complaint: Abdominal pain   IMPRESSION:  Transient, migratory abdominal pain - ? 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 Primary location of recent symptoms is back and shoulder. May be related to NSAIDs. MRI/MRCP planned for evaluation. Will repeat lipase today.   RUQ/Right flank pain: Not explained by CT 01/2020 or ultrasound and MRI. Unsure if this is GI in origin. Endoscopic evaluation recommended.    Fatty liver without fibrosis: Reviewed NASH Fibrosure and elastography results which made fibrosis unlikely. Plan restaging in the next 12-24 months.  Hepatic hemangioma: Reviewed benign findings from ultrasound and CT. It is too small to be the cause of her RUQ/right flank pain.   Pancreatic cyst: Reviewed MRI findings. Plan repeat MRI in one year.    PLAN: - Hepatic function panel, lipase, CBC with diff - Avoid NSAIDs - Omeprazole 20 mg daily - consider increased to 40 mg daily - Carafate 1g QID x 2 weeks - Await MRI results from next week - EGD to evaluate abdominal pain not explained by cross-sectional imaging  Please see the "Patient Instructions" section for addition details about the plan.  HPI: Chirsty [name] is a 60 y.o. female who returns in follow-up after her self-referred consultation for fatty liver 02/25/20.  She was last seen in  the office 04/17/20 with complaints of change in bowel habits, abdominal pain, and a pancreatic cyst.  Recommendations for labs, imaging studies, and endoscopic evaluation with EGD and colonoscopy were made but not performed.    She returns today for evaluation of abdominal pain and an elevated lipase.  She called with symptoms 01/01/2021 when the labs with Dr. Sharlet Salina showed an elevated lipase.   Today she reports right shoulder pain that she thinks is causing her stomach to hurt. It is a squeezing pain that starts in her back and radiates around to the front for the last 5 months. Pain developed after she moved furniture and she tried to treat it with heating pads. The character and nature of the pain has not changed. Several weeks ago she developed a separate lower shoulder pain.  She reported abdominal pain at the time of her initial consultation but feels this pain is different.   Continues to have transient, migrating abdominal pain. Feels like she has healthy diet with lots of fruits and vegetables. No identified triggering foods. Has a well formed bowel movement most days.   She has been using meloxicam daily since 2001 for arthritis. She does not use other NSAIDs. Has added Tylenol to her regimen given these recent pains.   Started using omeprazole 20 mg to try to help with the pain. She does not want to take the omeprazole or other medications long term.   She is now wondering if she needs to have an EGD given the pain.   Recent evaluation includes:   Lipase levels: 30   04/17/20 151  12/29/2020 64   01/05/2021  GI pathogen panel - 01/12/2021 Fecal calprotectin 810/21/22 CBC normal 04/17/2020  She was due for surveillance MRI in December and we scheduled this earlier to further evaluate her elevated lipase.  MRI/MRCP planned 01/17/2021.   Chronic symptoms that are not active today: History of oropharyngeal dysphagia evaluated by [facility] 03/2018. History of pelvic pain previously  evaluation at [facility] in 2018. Office notes at that time document intermittent pain since 2013. Pain described as a lower back that wraps around to the front with associated bloating.  In 2017 had a CT scan and was told cysts on kidneys and ovaries as well as liver hemangioma. Transvaginal ultrasound 2018 showed a small anterior myoma and small left cyst.    Fatty liver data: - Ultrasound with elastography 03/02/20: fatty liver, 2.2 x 2.2 x 2.6 cm hypoechoic lesion in the inferior right liver. median kPa 3.6 c/w high probability of being normal - MRI 03/14/2020 shows a 2.5 cm benign hemangioma in the right hepatic lobe corresponding to the findings on ultrasound, prior cholecystectomy without biliary ductal dilatation, and a 5 mm cystic lesion in the pancreatic body - [facility] 03/23/2020 shows F0S3 (marked or severe steatosis, no fibrosis, for a Nash grade of an 1  Prior endoscopic history: - Colonoscopy with Dr. Melina Copa 11/22/2013 to evaluate left upper quadrant abdominal pain showed pancolonic diverticulosis.  Surveillance recommended in 10 years.  Bentyl prescribed for pain. She confirmed these findings but notes that she rarely took it because she didn't feel she needed it.  - Endoscopic evaluation recommended at the time of her initial consultation but she did not schedule  No known family history of colon cancer or polyps. No family history of uterine/endometrial cancer, pancreatic cancer or gastric/stomach cancer.   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     B Complex Vitamins (B COMPLEX PO) Take 1 tablet by mouth daily.     Blood Glucose Monitoring Suppl w/Device KIT Use daily to check sugars 1 kit 0   Cholecalciferol (VITAMIN D3) 50 MCG (2000 UT) capsule Take 2,000 Units by mouth daily.     citalopram (CELEXA) 20 MG tablet Take 1 tablet (20 mg total) by mouth daily. 90 tablet 3   co-enzyme Q-10 30 MG capsule Take 30 mg by mouth daily.     ezetimibe (ZETIA) 10 MG tablet Take 1 tablet (10 mg total) by mouth daily. 90 tablet 3   fluticasone (FLONASE) 50 MCG/ACT nasal spray Place into both nostrils daily.     gabapentin (NEURONTIN) 600 MG tablet Take 1.5 tablets (900 mg total) by mouth at bedtime. 45 tablet 5   ketotifen (ZADITOR) 0.025 % ophthalmic solution 1 drop 2 (two) times daily.     meloxicam (MOBIC) 15 MG tablet Take 1 tablet (15 mg total) by mouth daily. 90 tablet 3   metFORMIN (GLUCOPHAGE) 1000 MG tablet Take  1 tablet (1,000 mg total) by mouth 2 (two) times daily with a meal. 180 tablet 3   Multiple Vitamin (MULTIVITAMIN) tablet Take 1 tablet by mouth daily.     OVER THE COUNTER MEDICATION Kratom 500 mg capsules two capsules three times a day     Probiotic Product (DIGESTIVE ADV DIGESTIVE/IMMUNE PO) Take by mouth.     vitamin E 1000 UNIT capsule Take 1,000 Units by mouth daily.     No current facility-administered medications for this visit.    Allergies as of 01/12/2021 - Review Complete 01/12/2021  Allergen Reaction Noted   Macrodantin [nitrofurantoin macrocrystal]  09/16/2011   Morphine and related Itching 09/16/2011   Other  04/17/2020    Family History  Problem Relation Age of Onset   Hypertension Mother    Hyperlipidemia Mother    Dementia Mother    Diabetes Mother    Hypertension Father    Heart attack Father    Kidney Stones Brother    Kidney Stones Son    Breast cancer Maternal Aunt    Leukemia Paternal Uncle    Kidney Stones Son        Physical  Exam: General:   Alert, in NAD. No scleral icterus. No bilateral temporal wasting. Lying on the examination bed through most of the visit.  Heart:  Regular rate and rhythm; no murmurs Pulm: Clear anteriorly; no wheezing Abdomen:  Soft. Tender in the RUQ and right flank, although I am unable to reproduce her symptoms on exam. Nondistended. Normal bowel sounds. No rebound or guarding. No fluid wave.  LAD: No inguinal or umbilical LAD Extremities:  Without edema. Neurologic:  Alert and  oriented x4;  grossly normal neurologically; no asterixis or clonus. Skin: No jaundice. No palmar erythema or spider angioma.  Psych:  Alert and cooperative. Normal mood and affect.     Rayland Hamed L. Tarri Glenn, MD, MPH 01/12/2021, 10:32 AM

## 2021-01-12 NOTE — Patient Instructions (Signed)
You have been scheduled for an endoscopy. Please follow written instructions given to you at your visit today. If you use inhalers (even only as needed), please bring them with you on the day of your procedure.  If you are age 60 or older, your body mass index should be between 23-30. Your Body mass index is 31.09 kg/m. If this is out of the aforementioned range listed, please consider follow up with your Primary Care Provider.  If you are age 6 or younger, your body mass index should be between 19-25. Your Body mass index is 31.09 kg/m. If this is out of the aformentioned range listed, please consider follow up with your Primary Care Provider.   ________________________________________________________  The [facility] providers would like to encourage you to use [facility] to communicate with providers for non-urgent requests or questions.  Due to long hold times on the telephone, sending your provider a message by [facility] may be a faster and more efficient way to get a response.  Please allow 48 business hours for a response.  Please remember that this is for non-urgent requests.  _______________________________________________________ Due to recent changes in healthcare laws, you may see the results of your imaging and laboratory studies on MyChart before your provider has had a chance to review them.  We understand that in some cases there may be results that are confusing or concerning to you. Not all laboratory results come back in the same time frame and the provider may be waiting for multiple results in order to interpret others.  Please give [place] 48 hours in order for your provider to thoroughly review all the results before contacting the office for clarification of your results.

## 2021-01-15 ENCOUNTER — Ambulatory Visit: Payer: Medicare Other | Admitting: Family Medicine

## 2021-01-15 ENCOUNTER — Encounter: Payer: Self-pay | Admitting: Internal Medicine

## 2021-01-15 DIAGNOSIS — F331 Major depressive disorder, recurrent, moderate: Secondary | ICD-10-CM

## 2021-01-15 DIAGNOSIS — M5441 Lumbago with sciatica, right side: Secondary | ICD-10-CM

## 2021-01-15 DIAGNOSIS — G8929 Other chronic pain: Secondary | ICD-10-CM

## 2021-01-15 DIAGNOSIS — M5440 Lumbago with sciatica, unspecified side: Secondary | ICD-10-CM

## 2021-01-16 NOTE — Progress Notes (Signed)
Appointment Information  Name: Kari, [name]" MRN: [id number]  Date: 01/17/2021 Status: Sch  Time: 7:00 AM Length: 60  Visit Type: MR ABD W/WO CM/MRCP [[id number]] Copay: $0.00  Provider: [facility] 1 Department: [facility]  Referring Provider: Thornton Park CSN: [id number]  Notes: Spoke w/ Pt ~ EO ~ [name] w PT [name]  Made On: Change Notes: Change Notes: Change Notes: Change Notes: 01/11/2021 2:03 PM 01/11/2021 2:04 PM 01/15/2021 8:13 AM 01/16/2021 11:37 AM 01/16/2021 11:41 AM By: By: By: By: By: Judithann Graves, SAUDIA M PRIDGEN, [place], [place], REBECCA L

## 2021-01-17 ENCOUNTER — Other Ambulatory Visit (INDEPENDENT_AMBULATORY_CARE_PROVIDER_SITE_OTHER): Payer: Medicare Other

## 2021-01-17 ENCOUNTER — Ambulatory Visit (HOSPITAL_COMMUNITY)
Admission: RE | Admit: 2021-01-17 | Discharge: 2021-01-17 | Disposition: A | Discharge status: Home / Self Care | Payer: Medicare Other | Source: Ambulatory Visit | Attending: Gastroenterology | Admitting: Gastroenterology

## 2021-01-17 ENCOUNTER — Other Ambulatory Visit: Payer: Self-pay

## 2021-01-17 ENCOUNTER — Telehealth: Payer: Self-pay | Admitting: *Deleted

## 2021-01-17 ENCOUNTER — Other Ambulatory Visit: Payer: Self-pay | Admitting: Gastroenterology

## 2021-01-17 ENCOUNTER — Telehealth (HOSPITAL_BASED_OUTPATIENT_CLINIC_OR_DEPARTMENT_OTHER): Payer: Self-pay

## 2021-01-17 DIAGNOSIS — D1803 Hemangioma of intra-abdominal structures: Secondary | ICD-10-CM | POA: Diagnosis not present

## 2021-01-17 DIAGNOSIS — R748 Abnormal levels of other serum enzymes: Secondary | ICD-10-CM | POA: Diagnosis not present

## 2021-01-17 DIAGNOSIS — K760 Fatty (change of) liver, not elsewhere classified: Secondary | ICD-10-CM

## 2021-01-17 DIAGNOSIS — K862 Cyst of pancreas: Secondary | ICD-10-CM | POA: Insufficient documentation

## 2021-01-17 DIAGNOSIS — R1011 Right upper quadrant pain: Secondary | ICD-10-CM

## 2021-01-17 DIAGNOSIS — R935 Abnormal findings on diagnostic imaging of other abdominal regions, including retroperitoneum: Secondary | ICD-10-CM

## 2021-01-17 DIAGNOSIS — N289 Disorder of kidney and ureter, unspecified: Secondary | ICD-10-CM | POA: Diagnosis not present

## 2021-01-17 DIAGNOSIS — R197 Diarrhea, unspecified: Secondary | ICD-10-CM

## 2021-01-17 DIAGNOSIS — K769 Liver disease, unspecified: Secondary | ICD-10-CM | POA: Diagnosis not present

## 2021-01-17 LAB — CREATININE, SERUM: Creatinine, Ser: 0.69 mg/dL (ref 0.40–1.20)

## 2021-01-17 LAB — LIPASE: Lipase: 58 U/L (ref 11.0–59.0)

## 2021-01-17 LAB — BUN: BUN: 20 mg/dL (ref 6–23)

## 2021-01-17 MED ORDER — GADOBUTROL 1 MMOL/ML IV SOLN
7.0000 mL | Freq: Once | INTRAVENOUS | Status: AC | PRN
  Administered 2021-01-17: 7 mL via INTRAVENOUS

## 2021-01-17 NOTE — Telephone Encounter (Signed)
Social worker referral done. Needs visit to assess. If she just had nerve block less than a year ago she should be able to contact that provider directly for a visit.

## 2021-01-17 NOTE — Chronic Care Management (AMB) (Signed)
  Chronic Care Management   Note  01/17/2021 Name: Kaly Mcquary MRN: [id number] DOB: 1960-06-05  Angelica Pou Yatzary Merriweather is a 60 y.o. year old female who is a primary care patient of Hoyt Koch, MD. I reached out to Mitchell Heir by phone today in response to a referral sent by Ms. Corena Herter Hildreth's PCP.  Ms. Adison Reifsteck was given information about Chronic Care Management services today including:  CCM service includes personalized support from designated clinical staff supervised by her physician, including individualized plan of care and coordination with other care providers 24/7 contact phone numbers for assistance for urgent and routine care needs. Service will only be billed when office clinical staff spend 20 minutes or more in a month to coordinate care. Only one practitioner may furnish and bill the service in a calendar month. The patient may stop CCM services at any time (effective at the end of the month) by phone call to the office staff. The patient is responsible for co-pay (up to 20% after annual deductible is met) if co-pay is required by the individual health plan.   Patient agreed to services and verbal consent obtained.   Follow up plan: Telephone appointment with care management team member scheduled for:01/24/21  [place] Management  Direct Dial: [phone]

## 2021-01-22 ENCOUNTER — Other Ambulatory Visit: Payer: Self-pay

## 2021-01-22 DIAGNOSIS — K862 Cyst of pancreas: Secondary | ICD-10-CM

## 2021-01-22 DIAGNOSIS — R935 Abnormal findings on diagnostic imaging of other abdominal regions, including retroperitoneum: Secondary | ICD-10-CM

## 2021-01-22 DIAGNOSIS — D1803 Hemangioma of intra-abdominal structures: Secondary | ICD-10-CM

## 2021-01-23 ENCOUNTER — Ambulatory Visit (HOSPITAL_BASED_OUTPATIENT_CLINIC_OR_DEPARTMENT_OTHER): Payer: Medicare Other | Attending: Pulmonary Disease | Admitting: Pulmonary Disease

## 2021-01-23 ENCOUNTER — Encounter (HOSPITAL_BASED_OUTPATIENT_CLINIC_OR_DEPARTMENT_OTHER): Payer: Self-pay

## 2021-01-23 ENCOUNTER — Encounter: Payer: Self-pay | Admitting: Internal Medicine

## 2021-01-24 ENCOUNTER — Telehealth: Payer: Medicare Other

## 2021-01-25 ENCOUNTER — Telehealth: Payer: Self-pay | Admitting: *Deleted

## 2021-01-25 NOTE — Telephone Encounter (Signed)
  Care Management   Follow Up Note   01/25/2021 Name: Kari [name] MRN: [id number] DOB: 1960-10-27   Referred by: Hoyt Koch, MD Reason for referral : No chief complaint on file.   An unsuccessful telephone outreach was attempted today. The patient was referred to the case management team for assistance with care management and care coordination.   Follow Up Plan: The care management team will reach out to the patient again over the next 7 days.  Eduard Clos MSW, LCSW Licensed Clinical Social Worker [facility] [phone]

## 2021-01-26 ENCOUNTER — Encounter: Payer: Medicare Other | Admitting: Gastroenterology

## 2021-01-26 ENCOUNTER — Telehealth: Payer: Self-pay | Admitting: *Deleted

## 2021-01-26 NOTE — Chronic Care Management (AMB) (Signed)
  Care Management   Note  01/26/2021 Name: Dorella Laster MRN: [id number] DOB: 1960/07/16  Angelica Pou Marinna Blane is a 60 y.o. year old female who is a primary care patient of Hoyt Koch, MD and is actively engaged with the care management team. I reached out to Mitchell Heir by phone today to assist with re-scheduling an initial visit with the Licensed Clinical Social Worker  Follow up plan: Patient declines further follow up and engagement by the care management team. Appropriate care team members and provider have been notified via electronic communication.  The care management team is available to follow up with the patient after provider conversation with the patient regarding recommendation for care management engagement and subsequent re-referral to the care management team.   [place] Management  Direct Dial: [phone]

## 2021-02-12 ENCOUNTER — Encounter: Payer: Self-pay | Admitting: Pulmonary Disease

## 2021-02-12 DIAGNOSIS — G4733 Obstructive sleep apnea (adult) (pediatric): Secondary | ICD-10-CM

## 2021-02-13 ENCOUNTER — Telehealth (HOSPITAL_BASED_OUTPATIENT_CLINIC_OR_DEPARTMENT_OTHER): Payer: Self-pay

## 2021-02-13 ENCOUNTER — Ambulatory Visit (HOSPITAL_BASED_OUTPATIENT_CLINIC_OR_DEPARTMENT_OTHER): Payer: Medicare Other

## 2021-02-13 DIAGNOSIS — R1084 Generalized abdominal pain: Secondary | ICD-10-CM | POA: Diagnosis not present

## 2021-02-13 DIAGNOSIS — Z1231 Encounter for screening mammogram for malignant neoplasm of breast: Secondary | ICD-10-CM

## 2021-02-13 NOTE — Telephone Encounter (Signed)
Place an order for auto titrating CPAP 5-20 to go to DME company

## 2021-02-16 NOTE — Telephone Encounter (Signed)
New DME order has been placed and message sent to patient. Nothing further needed at this time.

## 2021-02-16 NOTE — Telephone Encounter (Signed)
AO please advise on ordering a new machine.  Thanks

## 2021-02-16 NOTE — Telephone Encounter (Signed)
Okay to place an order for auto titrating CPAP 5-20 to go to DME company

## 2021-02-22 ENCOUNTER — Encounter: Payer: Self-pay | Admitting: Gastroenterology

## 2021-02-22 DIAGNOSIS — F32A Depression, unspecified: Secondary | ICD-10-CM | POA: Diagnosis not present

## 2021-02-22 DIAGNOSIS — D513 Other dietary vitamin B12 deficiency anemia: Secondary | ICD-10-CM | POA: Diagnosis not present

## 2021-02-22 DIAGNOSIS — R5382 Chronic fatigue, unspecified: Secondary | ICD-10-CM | POA: Diagnosis not present

## 2021-02-22 DIAGNOSIS — R079 Chest pain, unspecified: Secondary | ICD-10-CM | POA: Diagnosis not present

## 2021-02-22 DIAGNOSIS — J019 Acute sinusitis, unspecified: Secondary | ICD-10-CM | POA: Diagnosis not present

## 2021-02-22 DIAGNOSIS — E559 Vitamin D deficiency, unspecified: Secondary | ICD-10-CM | POA: Diagnosis not present

## 2021-02-22 DIAGNOSIS — R0609 Other forms of dyspnea: Secondary | ICD-10-CM | POA: Diagnosis not present

## 2021-02-22 DIAGNOSIS — M546 Pain in thoracic spine: Secondary | ICD-10-CM | POA: Diagnosis not present

## 2021-02-22 DIAGNOSIS — E119 Type 2 diabetes mellitus without complications: Secondary | ICD-10-CM | POA: Diagnosis not present

## 2021-02-22 DIAGNOSIS — Z23 Encounter for immunization: Secondary | ICD-10-CM | POA: Diagnosis not present

## 2021-02-22 DIAGNOSIS — Z1231 Encounter for screening mammogram for malignant neoplasm of breast: Secondary | ICD-10-CM | POA: Diagnosis not present

## 2021-02-22 DIAGNOSIS — R221 Localized swelling, mass and lump, neck: Secondary | ICD-10-CM | POA: Diagnosis not present

## 2021-02-22 DIAGNOSIS — E785 Hyperlipidemia, unspecified: Secondary | ICD-10-CM | POA: Diagnosis not present

## 2021-02-25 ENCOUNTER — Encounter: Payer: Self-pay | Admitting: Certified Registered Nurse Anesthetist

## 2021-02-26 ENCOUNTER — Other Ambulatory Visit: Payer: Self-pay

## 2021-02-26 ENCOUNTER — Ambulatory Visit (AMBULATORY_SURGERY_CENTER): Payer: Medicare Other | Admitting: Gastroenterology

## 2021-02-26 ENCOUNTER — Encounter: Payer: Self-pay | Admitting: Gastroenterology

## 2021-02-26 VITALS — BP 139/79 | HR 62 | Temp 98.4°F | Resp 12 | Ht 60.0 in | Wt 159.0 lb

## 2021-02-26 DIAGNOSIS — R109 Unspecified abdominal pain: Secondary | ICD-10-CM | POA: Diagnosis not present

## 2021-02-26 DIAGNOSIS — R748 Abnormal levels of other serum enzymes: Secondary | ICD-10-CM

## 2021-02-26 DIAGNOSIS — K2990 Gastroduodenitis, unspecified, without bleeding: Secondary | ICD-10-CM

## 2021-02-26 DIAGNOSIS — K2980 Duodenitis without bleeding: Secondary | ICD-10-CM

## 2021-02-26 DIAGNOSIS — K2970 Gastritis, unspecified, without bleeding: Secondary | ICD-10-CM

## 2021-02-26 MED ORDER — SODIUM CHLORIDE 0.9 % IV SOLN: 500.0000 mL | Freq: Once | INTRAVENOUS | Status: DC

## 2021-02-26 NOTE — Progress Notes (Signed)
Report given to PACU, vss 

## 2021-02-26 NOTE — Progress Notes (Signed)
Called to room to assist during endoscopic procedure.  Patient ID and intended procedure confirmed with present staff. Received instructions for my participation in the procedure from the performing physician.  

## 2021-02-26 NOTE — Progress Notes (Signed)
Referring Provider: Hoyt Koch, * Primary Care Physician:  Hoyt Koch, MD  Reason for Procedure:  Abdominal pain   IMPRESSION:  Abdominal pain Appropriate candidate for monitored anesthesia care  PLAN: EGD in the [place] today   HPI: Kari [name] is a 60 y.o. female presents for endoscopic evaluation of RUQ and right flank pain not explained by cross-sectional imaging. Please see my office note from 01/12/21 for complete details. No significant change in history or PE since that visit.    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     B Complex Vitamins (B COMPLEX PO) Take 1 tablet by mouth daily.     Blood Glucose Monitoring Suppl w/Device KIT Use daily to check sugars 1 kit 0   Cholecalciferol (VITAMIN D3) 50 MCG (2000 UT) capsule Take 2,000 Units by mouth daily.     citalopram (CELEXA) 20 MG tablet Take 1 tablet (20 mg total) by mouth daily. 90 tablet 3   co-enzyme Q-10 30 MG capsule Take 30 mg by mouth daily.     ezetimibe (ZETIA) 10 MG tablet Take 1 tablet (10 mg total) by mouth daily. 90 tablet 3   gabapentin (NEURONTIN) 600 MG tablet Take 1.5 tablets (900 mg total) by mouth at bedtime. 45 tablet 5   meloxicam (MOBIC) 15 MG tablet Take 1 tablet (15 mg total) by mouth daily. 90 tablet 3   metFORMIN (GLUCOPHAGE) 1000 MG tablet Take 1 tablet (1,000 mg total) by mouth 2 (two) times daily with a meal. 180 tablet 3   OVER THE COUNTER MEDICATION Kratom 500 mg capsules  two capsules three times a day     Probiotic Product (DIGESTIVE ADV DIGESTIVE/IMMUNE PO) Take by mouth.     vitamin E 1000 UNIT capsule Take 1,000 Units by mouth daily.     fluticasone (FLONASE) 50 MCG/ACT nasal spray Place into both nostrils daily.     ketotifen (ZADITOR) 0.025 % ophthalmic solution 1 drop 2 (two) times daily.     Multiple Vitamin (MULTIVITAMIN) tablet Take 1 tablet by mouth daily.     Current Facility-Administered Medications  Medication Dose Route Frequency Provider Last Rate Last Admin   0.9 %  sodium chloride infusion  500 mL Intravenous Once Thornton Park, MD        Allergies as of 02/26/2021 - Review Complete 02/26/2021  Allergen Reaction Noted   Macrodantin [nitrofurantoin macrocrystal]  09/16/2011   Morphine and related Itching 09/16/2011   Other  04/17/2020    Family History  Problem Relation Age of Onset   Hypertension Mother    Hyperlipidemia Mother    Dementia Mother    Diabetes Mother    Hypertension Father    Heart attack Father    Kidney Stones Brother    Kidney Stones Son    Breast cancer Maternal Aunt    Leukemia Paternal Uncle  Kidney Stones Son      Physical Exam: General:   Alert,  well-nourished, pleasant and cooperative in NAD Head:  Normocephalic and atraumatic. Eyes:  Sclera clear, no icterus.   Conjunctiva pink. Mouth:  No deformity or lesions.   Neck:  Supple; no masses or thyromegaly. Lungs:  Clear throughout to auscultation.   No wheezes. Heart:  Regular rate and rhythm; no murmurs. Abdomen:  Soft, non-tender, nondistended, normal bowel sounds, no rebound or guarding.  Msk:  Symmetrical. No boney deformities LAD: No inguinal or umbilical LAD Extremities:  No clubbing or edema. Neurologic:  Alert and  oriented x4;  grossly nonfocal Skin:  No obvious rash or bruise. Psych:  Alert and cooperative. Normal mood and affect.     Studies/Results: No results found.    [name], MD, MPH 02/26/2021, 1:01  PM

## 2021-02-26 NOTE — Progress Notes (Signed)
1235 Robinul 0.1 mg IV given due large amount of secretions upon assessment.  MD made aware, vss

## 2021-02-26 NOTE — Progress Notes (Signed)
Pt's states no medical or surgical changes since previsit or office visit. 

## 2021-02-26 NOTE — Op Note (Signed)
[facility] Patient Name: Kari [name] Procedure Date: 02/26/2021 12:34 PM MRN: [id number] Endoscopist: Thornton Park MD, MD Age: 60 Referring MD:  Date of Birth: Aug 18, 1960 Gender: Female Account #: [id number] Procedure:                Upper GI endoscopy Indications:              Abdominal pain - right upper quadrant and right                            flank pain - not explained by cross-sectional                            imaging Medicines:                Monitored Anesthesia Care Procedure:                Pre-Anesthesia Assessment:                           - Prior to the procedure, a History and Physical                            was performed, and patient medications and                            allergies were reviewed. The patient's tolerance of                            previous anesthesia was also reviewed. The risks                            and benefits of the procedure and the sedation                            options and risks were discussed with the patient.                            All questions were answered, and informed consent                            was obtained. Prior Anticoagulants: The patient has                            taken no previous anticoagulant or antiplatelet                            agents. ASA Grade Assessment: II - A patient with                            mild systemic disease. After reviewing the risks                            and benefits, the patient was deemed in  satisfactory condition to undergo the procedure.                           After obtaining informed consent, the endoscope was                            passed under direct vision. Throughout the                            procedure, the patient's blood pressure, pulse, and                            oxygen saturations were monitored continuously. The                            GIF [id number] was introduced  through the                            mouth, and advanced to the third part of duodenum.                            The upper GI endoscopy was accomplished without                            difficulty. The patient tolerated the procedure                            well. Scope In: Scope Out: Findings:                 The examined esophagus was normal. The z-line is                            located 36 cm from the incisors.                           Diffuse mildly erythematous mucosa without bleeding                            was found in the gastric body. Biopsies were taken                            from the antrum, body, and fundus with a cold                            forceps for histology. Estimated blood loss was                            minimal.                           Diffuse mildly erythematous mucosa without active                            bleeding and with no stigmata of bleeding was  found                            in the duodenal bulb. Biopsies were taken with a                            cold forceps for histology. Estimated blood loss                            was minimal.                           The cardia and gastric fundus were normal on                            retroflexion.                           The exam was otherwise without abnormality. Complications:            No immediate complications. Estimated blood loss:                            Minimal. Estimated Blood Loss:     Estimated blood loss was minimal. Impression:               - Normal esophagus.                           - Erythematous mucosa in the gastric body. Biopsied.                           - Erythematous duodenopathy. Biopsied.                           - The examination was otherwise normal. Recommendation:           - Patient has a contact number available for                            emergencies. The signs and symptoms of potential                            delayed  complications were discussed with the                            patient. Return to normal activities tomorrow.                            Written discharge instructions were provided to the                            patient.                           - Resume previous diet.                           -  Continue present medications.                           - Avoid all NSAIDs.                           - Await pathology results.                           - Consider colonoscopy if symptoms persist and                            biopsies are normal. Thornton Park MD, MD 02/26/2021 1:18:32 PM This report has been signed electronically.

## 2021-02-26 NOTE — Patient Instructions (Signed)
Please read handouts provided. Continue present medications. Await pathology results. Avoid all NSAIDs.   YOU HAD AN ENDOSCOPIC PROCEDURE TODAY AT [facility]:   Refer to the procedure report that was given to you for any specific questions about what was found during the examination.  If the procedure report does not answer your questions, please call your gastroenterologist to clarify.  If you requested that your care partner not be given the details of your procedure findings, then the procedure report has been included in a sealed envelope for you to review at your convenience later.  YOU SHOULD EXPECT: Some feelings of bloating in the abdomen. Passage of more gas than usual.  Walking can help get rid of the air that was put into your GI tract during the procedure and reduce the bloating. If you had a lower endoscopy (such as a colonoscopy or flexible sigmoidoscopy) you may notice spotting of blood in your stool or on the toilet paper. If you underwent a bowel prep for your procedure, you may not have a normal bowel movement for a few days.  Please Note:  You might notice some irritation and congestion in your nose or some drainage.  This is from the oxygen used during your procedure.  There is no need for concern and it should clear up in a day or so.  SYMPTOMS TO REPORT IMMEDIATELY:   Following upper endoscopy (EGD)  Vomiting of blood or coffee ground material  New chest pain or pain under the shoulder blades  Painful or persistently difficult swallowing  New shortness of breath  Fever of 100F or higher  Black, tarry-looking stools  For urgent or emergent issues, a gastroenterologist can be reached at any hour by calling [phone]. Do not use MyChart messaging for urgent concerns.    DIET:  We do recommend a small meal at first, but then you may proceed to your regular diet.  Drink plenty of fluids but you should avoid alcoholic beverages for 24  hours.  ACTIVITY:  You should plan to take it easy for the rest of today and you should NOT DRIVE or use heavy machinery until tomorrow (because of the sedation medicines used during the test).    FOLLOW UP: Our staff will call the number listed on your records 48-72 hours following your procedure to check on you and address any questions or concerns that you may have regarding the information given to you following your procedure. If we do not reach you, we will leave a message.  We will attempt to reach you two times.  During this call, we will ask if you have developed any symptoms of COVID 19. If you develop any symptoms (ie: fever, flu-like symptoms, shortness of breath, cough etc.) before then, please call [phone].  If you test positive for Covid 19 in the 2 weeks post procedure, please call and report this information to [place].    If any biopsies were taken you will be contacted by phone or by letter within the next 1-3 weeks.  Please call us at [phone] if you have not heard about the biopsies in 3 weeks.    SIGNATURES/CONFIDENTIALITY: You and/or your care partner have signed paperwork which will be entered into your electronic medical record.  These signatures attest to the fact that that the information above on your After Visit Summary has been reviewed and is understood.  Full responsibility of the confidentiality of this discharge information lies with you and/or your  care-partner.  

## 2021-02-26 NOTE — Progress Notes (Signed)
C.W. vital signs. 

## 2021-02-27 ENCOUNTER — Ambulatory Visit: Payer: Medicare Other | Admitting: Internal Medicine

## 2021-02-28 ENCOUNTER — Telehealth: Payer: Self-pay

## 2021-02-28 NOTE — Telephone Encounter (Signed)
Called [phone] and left a message we tried to reach pt for a follow up call. maw

## 2021-02-28 NOTE — Telephone Encounter (Signed)
  Follow up Call-  Call back number 02/26/2021  Post procedure Call Back phone  # [phone]  Permission to leave phone message Yes  Some recent data might be hidden     Patient questions:  Do you have a fever, pain , or abdominal swelling? No. Pain Score  0 *  Have you tolerated food without any problems? Yes.    Have you been able to return to your normal activities? Yes.    Do you have any questions about your discharge instructions: Diet   No. Medications  No. Follow up visit  No.  Do you have questions or concerns about your Care? No.  Actions: * If pain score is 4 or above: No action needed, pain <4.

## 2021-03-01 ENCOUNTER — Encounter: Payer: Self-pay | Admitting: Gastroenterology

## 2021-03-02 ENCOUNTER — Encounter: Payer: Self-pay | Admitting: Gastroenterology

## 2021-03-02 ENCOUNTER — Telehealth: Payer: Self-pay

## 2021-03-02 ENCOUNTER — Telehealth: Payer: Self-pay | Admitting: Gastroenterology

## 2021-03-02 ENCOUNTER — Other Ambulatory Visit: Payer: Self-pay

## 2021-03-02 DIAGNOSIS — K2990 Gastroduodenitis, unspecified, without bleeding: Secondary | ICD-10-CM

## 2021-03-02 DIAGNOSIS — R1084 Generalized abdominal pain: Secondary | ICD-10-CM

## 2021-03-02 DIAGNOSIS — K2970 Gastritis, unspecified, without bleeding: Secondary | ICD-10-CM

## 2021-03-02 DIAGNOSIS — R112 Nausea with vomiting, unspecified: Secondary | ICD-10-CM

## 2021-03-02 MED ORDER — SUCRALFATE 1 G PO TABS: 1.0000 g | ORAL_TABLET | Freq: Four times a day (QID) | ORAL | 2 refills | Status: DC | PRN

## 2021-03-02 MED ORDER — ONDANSETRON HCL 4 MG PO TABS: 4.0000 mg | ORAL_TABLET | Freq: Three times a day (TID) | ORAL | 0 refills | Status: DC | PRN

## 2021-03-02 MED ORDER — DICYCLOMINE HCL 10 MG PO CAPS
10.0000 mg | ORAL_CAPSULE | Freq: Four times a day (QID) | ORAL | 0 refills | Status: DC | PRN
Start: 2021-03-02 — End: 2021-04-24

## 2021-03-02 NOTE — Telephone Encounter (Signed)
According to Dr. Tarri Glenn POT, pt was advised to take Carafate 1g QID x 2 weeks, then stop. There would not be a Rx sent as she would no longer be taking this medication. Her concerns have also been addressed in NUMEROUS My Chart messages the she has sent today (12/9)

## 2021-03-02 NOTE — Telephone Encounter (Signed)
MyChart message sent to pt

## 2021-03-02 NOTE — Telephone Encounter (Signed)
Pt has sent multiple incomplete My Chart messages. Called pt to further discuss her concerns. States her symptoms started 03/01/21. She has been experiencing worsening epigastric pain and nausea with emesis. Pt could not tell me how many episodes of emesis. Denies fever or chills. Has been taking Omeprazole 20mg  BID which is not helping to resolve her abd discomfort. Further added that she has been having loose stools daily that are brown in color and without odor. When asked, pt could not remember how many loose stools only that the consistency of her stool is loose. Asked about fluid intake and dietary habits. Pt states she drink approx 6 - 16 oz bottles of tea per day along with 8 oz of water. Was able to eat a sandwich yesterday that consisted of white bread and some cream cheese although could not recall any other toppings on the sandwich. Provided the following advice:  If symptoms persist, proceed to ED for further evaluation and IV fluid replacement Stop drinking tea as caffeine and artificial sweeteners may be triggering her symptoms Avoid simple carbohydrates and dairy as this seems to be triggering her symptoms Drink 64 ounces or more of water daily. Given her loose stools and vomiting, will need to drink Pedialyte and/or Gatorade to replenish electrolytes Continue Omeprazole as ordered Will send Rx for Carafate according to Dr. Tarri Glenn POC Given symptoms of nausea with emesis, will send message to Dr. Tarri Glenn for antiemetic Will send message to Dr. Tarri Glenn to determine if pt may benefit from Bentyl Will send message to Dr. Tarri Glenn to determine if labs and/or any other diagnostic studies may be needed at this time to further eval symptoms  Before I was able to offer pt an Urgent appt with PA, pt cut me off by saying, "I don't mean to sound snarky but I really don't feel like talking right now." Further added, "whatever you need to say, send me a message because I just don't feel good."  Advised, we are merely trying to help as she reached to our office requesting our assistance. We understand she is not feeling but if her symptoms are severe enough, she may want to proceed to the ED for further eval. Pt expressed appreciation for the recommendations provided, then disconnected.

## 2021-03-02 NOTE — Telephone Encounter (Signed)
Patient called states she never was able to get the Carafate because it was never sent to her pharmacy. States she is needing the medication today because she is really nauseous.

## 2021-03-05 ENCOUNTER — Encounter: Payer: Self-pay | Admitting: Gastroenterology

## 2021-03-06 ENCOUNTER — Encounter: Payer: Self-pay | Admitting: Gastroenterology

## 2021-03-08 ENCOUNTER — Encounter: Payer: Medicare Other | Admitting: Obstetrics and Gynecology

## 2021-04-03 DIAGNOSIS — G894 Chronic pain syndrome: Secondary | ICD-10-CM | POA: Diagnosis not present

## 2021-04-03 DIAGNOSIS — Z79899 Other long term (current) drug therapy: Secondary | ICD-10-CM | POA: Diagnosis not present

## 2021-04-03 DIAGNOSIS — M549 Dorsalgia, unspecified: Secondary | ICD-10-CM | POA: Diagnosis not present

## 2021-04-03 DIAGNOSIS — Z1389 Encounter for screening for other disorder: Secondary | ICD-10-CM | POA: Diagnosis not present

## 2021-04-04 DIAGNOSIS — R300 Dysuria: Secondary | ICD-10-CM | POA: Diagnosis not present

## 2021-04-04 DIAGNOSIS — K2960 Other gastritis without bleeding: Secondary | ICD-10-CM | POA: Diagnosis not present

## 2021-04-04 DIAGNOSIS — G8929 Other chronic pain: Secondary | ICD-10-CM | POA: Diagnosis not present

## 2021-04-04 DIAGNOSIS — M549 Dorsalgia, unspecified: Secondary | ICD-10-CM | POA: Diagnosis not present

## 2021-04-06 ENCOUNTER — Encounter: Payer: Self-pay | Admitting: Gastroenterology

## 2021-04-17 ENCOUNTER — Other Ambulatory Visit: Payer: Self-pay

## 2021-04-17 ENCOUNTER — Ambulatory Visit (AMBULATORY_SURGERY_CENTER): Payer: Self-pay | Admitting: *Deleted

## 2021-04-17 ENCOUNTER — Other Ambulatory Visit: Payer: Self-pay | Admitting: Gastroenterology

## 2021-04-17 VITALS — Ht 60.0 in | Wt 155.0 lb

## 2021-04-17 DIAGNOSIS — R748 Abnormal levels of other serum enzymes: Secondary | ICD-10-CM

## 2021-04-17 DIAGNOSIS — R1011 Right upper quadrant pain: Secondary | ICD-10-CM

## 2021-04-17 MED ORDER — PLENVU 140 G PO SOLR
1.0000 | Freq: Once | ORAL | 0 refills | Status: AC
Start: 2021-04-17 — End: 2021-04-17

## 2021-04-17 NOTE — Progress Notes (Signed)
No egg or soy allergy known to patient  No issues known to pt with past sedation with any surgeries or procedures Patient denies ever being told they had issues or difficulty with intubation  No FH of Malignant Hyperthermia Pt is not on diet pills Pt is not on  home 02  Pt is not on blood thinners  Pt denies issues with constipation  No A fib or A flutter  Pt is  vaccinated  for Covid   PLENVU Coupon to pt in PV today , Code to Pharmacy and  NO PA's for preps discussed with pt In PV today  Discussed with pt there will be an out-of-pocket cost for prep and that varies from $0 to 70 +  dollars - pt verbalized understanding   Due to the COVID-19 pandemic we are asking patients to follow certain guidelines in PV and the [facility]   Pt aware of COVID protocols and [facility] guidelines   PV completed over the phone. Pt verified name, DOB, address and insurance during PV today.  Pt mailed instruction packet with copy of consent form to read and not return, and instructions.  Pt encouraged to call with questions or issues.  If pt has My chart, procedure instructions sent via My Chart    ASK pt. If currently having issues,she stated "I am feeling better I think the carafate has helped"

## 2021-04-20 DIAGNOSIS — G4733 Obstructive sleep apnea (adult) (pediatric): Secondary | ICD-10-CM | POA: Diagnosis not present

## 2021-04-23 DIAGNOSIS — M5134 Other intervertebral disc degeneration, thoracic region: Secondary | ICD-10-CM | POA: Diagnosis not present

## 2021-04-23 DIAGNOSIS — R5382 Chronic fatigue, unspecified: Secondary | ICD-10-CM | POA: Diagnosis not present

## 2021-04-23 DIAGNOSIS — Z1389 Encounter for screening for other disorder: Secondary | ICD-10-CM | POA: Diagnosis not present

## 2021-04-23 DIAGNOSIS — M5136 Other intervertebral disc degeneration, lumbar region: Secondary | ICD-10-CM | POA: Diagnosis not present

## 2021-04-23 DIAGNOSIS — M47816 Spondylosis without myelopathy or radiculopathy, lumbar region: Secondary | ICD-10-CM | POA: Diagnosis not present

## 2021-04-23 DIAGNOSIS — E559 Vitamin D deficiency, unspecified: Secondary | ICD-10-CM | POA: Diagnosis not present

## 2021-04-23 DIAGNOSIS — G894 Chronic pain syndrome: Secondary | ICD-10-CM | POA: Diagnosis not present

## 2021-04-23 DIAGNOSIS — R030 Elevated blood-pressure reading, without diagnosis of hypertension: Secondary | ICD-10-CM | POA: Diagnosis not present

## 2021-04-23 DIAGNOSIS — R609 Edema, unspecified: Secondary | ICD-10-CM | POA: Diagnosis not present

## 2021-04-23 DIAGNOSIS — R0601 Orthopnea: Secondary | ICD-10-CM | POA: Diagnosis not present

## 2021-04-23 DIAGNOSIS — R0600 Dyspnea, unspecified: Secondary | ICD-10-CM | POA: Diagnosis not present

## 2021-04-23 DIAGNOSIS — M549 Dorsalgia, unspecified: Secondary | ICD-10-CM | POA: Diagnosis not present

## 2021-04-24 ENCOUNTER — Encounter: Payer: Self-pay | Admitting: *Deleted

## 2021-04-24 ENCOUNTER — Encounter: Payer: Self-pay | Admitting: Cardiology

## 2021-04-24 DIAGNOSIS — M5414 Radiculopathy, thoracic region: Secondary | ICD-10-CM | POA: Insufficient documentation

## 2021-04-24 DIAGNOSIS — G8929 Other chronic pain: Secondary | ICD-10-CM | POA: Diagnosis not present

## 2021-04-24 DIAGNOSIS — M549 Dorsalgia, unspecified: Secondary | ICD-10-CM | POA: Diagnosis not present

## 2021-04-24 DIAGNOSIS — N83209 Unspecified ovarian cyst, unspecified side: Secondary | ICD-10-CM | POA: Insufficient documentation

## 2021-04-24 DIAGNOSIS — Z7689 Persons encountering health services in other specified circumstances: Secondary | ICD-10-CM

## 2021-04-24 DIAGNOSIS — Z9889 Other specified postprocedural states: Secondary | ICD-10-CM | POA: Insufficient documentation

## 2021-04-24 DIAGNOSIS — E785 Hyperlipidemia, unspecified: Secondary | ICD-10-CM | POA: Diagnosis not present

## 2021-04-24 DIAGNOSIS — G5600 Carpal tunnel syndrome, unspecified upper limb: Secondary | ICD-10-CM | POA: Diagnosis not present

## 2021-04-24 DIAGNOSIS — M5090 Cervical disc disorder, unspecified, unspecified cervical region: Secondary | ICD-10-CM | POA: Diagnosis not present

## 2021-04-24 DIAGNOSIS — E559 Vitamin D deficiency, unspecified: Secondary | ICD-10-CM | POA: Diagnosis not present

## 2021-04-24 DIAGNOSIS — R5382 Chronic fatigue, unspecified: Secondary | ICD-10-CM | POA: Diagnosis not present

## 2021-04-24 DIAGNOSIS — M129 Arthropathy, unspecified: Secondary | ICD-10-CM | POA: Diagnosis not present

## 2021-04-24 DIAGNOSIS — K2960 Other gastritis without bleeding: Secondary | ICD-10-CM | POA: Diagnosis not present

## 2021-04-24 DIAGNOSIS — E119 Type 2 diabetes mellitus without complications: Secondary | ICD-10-CM | POA: Diagnosis not present

## 2021-04-24 HISTORY — DX: Other specified postprocedural states: Z98.890

## 2021-04-24 HISTORY — DX: Unspecified ovarian cyst, unspecified side: N83.209

## 2021-04-24 HISTORY — DX: Radiculopathy, thoracic region: M54.14

## 2021-04-24 HISTORY — DX: Persons encountering health services in other specified circumstances: Z76.89

## 2021-04-27 ENCOUNTER — Other Ambulatory Visit: Payer: Self-pay

## 2021-04-27 DIAGNOSIS — T7840XA Allergy, unspecified, initial encounter: Secondary | ICD-10-CM | POA: Insufficient documentation

## 2021-04-27 DIAGNOSIS — J45909 Unspecified asthma, uncomplicated: Secondary | ICD-10-CM | POA: Insufficient documentation

## 2021-04-27 DIAGNOSIS — N189 Chronic kidney disease, unspecified: Secondary | ICD-10-CM | POA: Insufficient documentation

## 2021-04-27 DIAGNOSIS — K529 Noninfective gastroenteritis and colitis, unspecified: Secondary | ICD-10-CM | POA: Insufficient documentation

## 2021-04-27 DIAGNOSIS — G4733 Obstructive sleep apnea (adult) (pediatric): Secondary | ICD-10-CM | POA: Diagnosis not present

## 2021-04-27 DIAGNOSIS — M1990 Unspecified osteoarthritis, unspecified site: Secondary | ICD-10-CM | POA: Insufficient documentation

## 2021-04-27 DIAGNOSIS — I10 Essential (primary) hypertension: Secondary | ICD-10-CM | POA: Insufficient documentation

## 2021-04-30 ENCOUNTER — Encounter: Payer: Self-pay | Admitting: Gastroenterology

## 2021-05-01 ENCOUNTER — Telehealth: Payer: Self-pay | Admitting: Gastroenterology

## 2021-05-01 NOTE — Telephone Encounter (Signed)
Hey Dr. Tarri Glenn,   Patient called in to cancel procedure 2/9. States she just do not want to have the procedure.   Thank you

## 2021-05-01 NOTE — Telephone Encounter (Signed)
Noted  

## 2021-05-03 ENCOUNTER — Encounter: Payer: Medicare Other | Admitting: Gastroenterology

## 2021-05-25 DIAGNOSIS — G4733 Obstructive sleep apnea (adult) (pediatric): Secondary | ICD-10-CM | POA: Diagnosis not present

## 2021-05-26 DIAGNOSIS — R109 Unspecified abdominal pain: Secondary | ICD-10-CM | POA: Diagnosis not present

## 2021-05-26 DIAGNOSIS — I700 Atherosclerosis of aorta: Secondary | ICD-10-CM | POA: Diagnosis not present

## 2021-05-26 DIAGNOSIS — R1032 Left lower quadrant pain: Secondary | ICD-10-CM | POA: Diagnosis not present

## 2021-05-26 DIAGNOSIS — M545 Low back pain, unspecified: Secondary | ICD-10-CM | POA: Diagnosis not present

## 2021-05-29 ENCOUNTER — Encounter: Payer: Self-pay | Admitting: Gastroenterology

## 2021-05-30 ENCOUNTER — Other Ambulatory Visit: Payer: Self-pay

## 2021-05-31 ENCOUNTER — Ambulatory Visit: Payer: Medicare Other | Admitting: Nurse Practitioner

## 2021-05-31 ENCOUNTER — Ambulatory Visit: Payer: Medicare Other | Admitting: Cardiology

## 2021-06-25 DIAGNOSIS — Z136 Encounter for screening for cardiovascular disorders: Secondary | ICD-10-CM | POA: Diagnosis not present

## 2021-06-25 DIAGNOSIS — Z131 Encounter for screening for diabetes mellitus: Secondary | ICD-10-CM | POA: Diagnosis not present

## 2021-06-25 DIAGNOSIS — G4733 Obstructive sleep apnea (adult) (pediatric): Secondary | ICD-10-CM | POA: Diagnosis not present

## 2021-06-25 DIAGNOSIS — R030 Elevated blood-pressure reading, without diagnosis of hypertension: Secondary | ICD-10-CM | POA: Diagnosis not present

## 2021-06-25 DIAGNOSIS — E119 Type 2 diabetes mellitus without complications: Secondary | ICD-10-CM | POA: Diagnosis not present

## 2021-06-25 DIAGNOSIS — E782 Mixed hyperlipidemia: Secondary | ICD-10-CM | POA: Diagnosis not present

## 2021-06-25 DIAGNOSIS — R609 Edema, unspecified: Secondary | ICD-10-CM | POA: Diagnosis not present

## 2021-06-26 ENCOUNTER — Other Ambulatory Visit: Payer: Self-pay | Admitting: Internal Medicine

## 2021-07-04 ENCOUNTER — Encounter: Payer: Medicare Other | Admitting: Obstetrics and Gynecology

## 2021-07-09 DIAGNOSIS — G4733 Obstructive sleep apnea (adult) (pediatric): Secondary | ICD-10-CM | POA: Diagnosis not present

## 2021-07-25 DIAGNOSIS — G4733 Obstructive sleep apnea (adult) (pediatric): Secondary | ICD-10-CM | POA: Diagnosis not present

## 2021-08-07 DIAGNOSIS — Z136 Encounter for screening for cardiovascular disorders: Secondary | ICD-10-CM | POA: Diagnosis not present

## 2021-08-07 DIAGNOSIS — R350 Frequency of micturition: Secondary | ICD-10-CM | POA: Diagnosis not present

## 2021-08-07 DIAGNOSIS — N3000 Acute cystitis without hematuria: Secondary | ICD-10-CM | POA: Diagnosis not present

## 2021-08-15 DIAGNOSIS — R531 Weakness: Secondary | ICD-10-CM | POA: Diagnosis not present

## 2021-08-15 DIAGNOSIS — Z136 Encounter for screening for cardiovascular disorders: Secondary | ICD-10-CM | POA: Diagnosis not present

## 2021-08-15 DIAGNOSIS — R5383 Other fatigue: Secondary | ICD-10-CM | POA: Diagnosis not present

## 2021-08-15 DIAGNOSIS — M546 Pain in thoracic spine: Secondary | ICD-10-CM | POA: Diagnosis not present

## 2021-08-15 DIAGNOSIS — R109 Unspecified abdominal pain: Secondary | ICD-10-CM | POA: Diagnosis not present

## 2021-08-15 DIAGNOSIS — R1084 Generalized abdominal pain: Secondary | ICD-10-CM | POA: Diagnosis not present

## 2021-08-15 LAB — HM HIV SCREENING LAB: HM HIV Screening: NEGATIVE

## 2021-08-15 LAB — HM HEPATITIS C SCREENING LAB: HM Hepatitis Screen: NEGATIVE

## 2021-08-17 DIAGNOSIS — N281 Cyst of kidney, acquired: Secondary | ICD-10-CM | POA: Diagnosis not present

## 2021-08-17 DIAGNOSIS — R1084 Generalized abdominal pain: Secondary | ICD-10-CM | POA: Diagnosis not present

## 2021-08-17 DIAGNOSIS — M546 Pain in thoracic spine: Secondary | ICD-10-CM | POA: Diagnosis not present

## 2021-08-17 DIAGNOSIS — D1803 Hemangioma of intra-abdominal structures: Secondary | ICD-10-CM | POA: Diagnosis not present

## 2021-08-25 DIAGNOSIS — G4733 Obstructive sleep apnea (adult) (pediatric): Secondary | ICD-10-CM | POA: Diagnosis not present

## 2021-08-30 DIAGNOSIS — R5382 Chronic fatigue, unspecified: Secondary | ICD-10-CM | POA: Diagnosis not present

## 2021-08-30 DIAGNOSIS — D1803 Hemangioma of intra-abdominal structures: Secondary | ICD-10-CM | POA: Diagnosis not present

## 2021-08-30 DIAGNOSIS — Z1231 Encounter for screening mammogram for malignant neoplasm of breast: Secondary | ICD-10-CM | POA: Diagnosis not present

## 2021-08-30 DIAGNOSIS — R531 Weakness: Secondary | ICD-10-CM | POA: Diagnosis not present

## 2021-08-30 DIAGNOSIS — Z136 Encounter for screening for cardiovascular disorders: Secondary | ICD-10-CM | POA: Diagnosis not present

## 2021-08-30 DIAGNOSIS — M1990 Unspecified osteoarthritis, unspecified site: Secondary | ICD-10-CM | POA: Diagnosis not present

## 2021-08-30 DIAGNOSIS — R634 Abnormal weight loss: Secondary | ICD-10-CM | POA: Diagnosis not present

## 2021-08-30 DIAGNOSIS — R609 Edema, unspecified: Secondary | ICD-10-CM | POA: Diagnosis not present

## 2021-08-30 DIAGNOSIS — K869 Disease of pancreas, unspecified: Secondary | ICD-10-CM | POA: Diagnosis not present

## 2021-09-04 ENCOUNTER — Other Ambulatory Visit: Payer: Self-pay | Admitting: Internal Medicine

## 2021-09-20 DIAGNOSIS — G8929 Other chronic pain: Secondary | ICD-10-CM | POA: Diagnosis not present

## 2021-09-20 DIAGNOSIS — R5382 Chronic fatigue, unspecified: Secondary | ICD-10-CM | POA: Diagnosis not present

## 2021-09-20 DIAGNOSIS — Z136 Encounter for screening for cardiovascular disorders: Secondary | ICD-10-CM | POA: Diagnosis not present

## 2021-09-20 DIAGNOSIS — M546 Pain in thoracic spine: Secondary | ICD-10-CM | POA: Diagnosis not present

## 2021-09-20 DIAGNOSIS — G4733 Obstructive sleep apnea (adult) (pediatric): Secondary | ICD-10-CM | POA: Diagnosis not present

## 2021-09-24 DIAGNOSIS — G4733 Obstructive sleep apnea (adult) (pediatric): Secondary | ICD-10-CM | POA: Diagnosis not present

## 2021-10-23 DIAGNOSIS — D1803 Hemangioma of intra-abdominal structures: Secondary | ICD-10-CM | POA: Diagnosis not present

## 2021-10-23 DIAGNOSIS — R1084 Generalized abdominal pain: Secondary | ICD-10-CM | POA: Diagnosis not present

## 2021-10-25 DIAGNOSIS — G4733 Obstructive sleep apnea (adult) (pediatric): Secondary | ICD-10-CM | POA: Diagnosis not present

## 2021-11-02 ENCOUNTER — Other Ambulatory Visit: Payer: Self-pay | Admitting: Internal Medicine

## 2021-11-17 ENCOUNTER — Other Ambulatory Visit: Payer: Self-pay | Admitting: Internal Medicine

## 2021-11-21 DIAGNOSIS — E119 Type 2 diabetes mellitus without complications: Secondary | ICD-10-CM | POA: Diagnosis not present

## 2021-11-21 DIAGNOSIS — Z7182 Exercise counseling: Secondary | ICD-10-CM | POA: Diagnosis not present

## 2021-11-21 DIAGNOSIS — M5136 Other intervertebral disc degeneration, lumbar region: Secondary | ICD-10-CM | POA: Diagnosis not present

## 2021-11-21 DIAGNOSIS — M546 Pain in thoracic spine: Secondary | ICD-10-CM | POA: Diagnosis not present

## 2021-11-21 DIAGNOSIS — Z136 Encounter for screening for cardiovascular disorders: Secondary | ICD-10-CM | POA: Diagnosis not present

## 2021-11-21 DIAGNOSIS — R0602 Shortness of breath: Secondary | ICD-10-CM | POA: Diagnosis not present

## 2021-11-21 DIAGNOSIS — Z713 Dietary counseling and surveillance: Secondary | ICD-10-CM | POA: Diagnosis not present

## 2021-11-21 DIAGNOSIS — R5382 Chronic fatigue, unspecified: Secondary | ICD-10-CM | POA: Diagnosis not present

## 2021-11-21 DIAGNOSIS — E785 Hyperlipidemia, unspecified: Secondary | ICD-10-CM | POA: Diagnosis not present

## 2021-11-21 DIAGNOSIS — I10 Essential (primary) hypertension: Secondary | ICD-10-CM | POA: Diagnosis not present

## 2021-11-25 DIAGNOSIS — G4733 Obstructive sleep apnea (adult) (pediatric): Secondary | ICD-10-CM | POA: Diagnosis not present

## 2021-12-07 DIAGNOSIS — G4733 Obstructive sleep apnea (adult) (pediatric): Secondary | ICD-10-CM | POA: Diagnosis not present

## 2021-12-25 DIAGNOSIS — G4733 Obstructive sleep apnea (adult) (pediatric): Secondary | ICD-10-CM | POA: Diagnosis not present

## 2021-12-28 ENCOUNTER — Telehealth: Payer: Self-pay

## 2021-12-28 NOTE — Telephone Encounter (Signed)
Patient is aware of follow up MRI & has been provided scheduling number. Follow up has been scheduled for 02/19/22 at 10:10 am with Dr. Tarri Glenn.

## 2022-01-01 DIAGNOSIS — Z136 Encounter for screening for cardiovascular disorders: Secondary | ICD-10-CM | POA: Diagnosis not present

## 2022-01-01 DIAGNOSIS — R601 Generalized edema: Secondary | ICD-10-CM | POA: Diagnosis not present

## 2022-01-01 DIAGNOSIS — Z79899 Other long term (current) drug therapy: Secondary | ICD-10-CM | POA: Diagnosis not present

## 2022-01-01 DIAGNOSIS — Z713 Dietary counseling and surveillance: Secondary | ICD-10-CM | POA: Diagnosis not present

## 2022-01-01 DIAGNOSIS — Z7182 Exercise counseling: Secondary | ICD-10-CM | POA: Diagnosis not present

## 2022-01-02 ENCOUNTER — Other Ambulatory Visit: Payer: Self-pay | Admitting: Gastroenterology

## 2022-01-02 ENCOUNTER — Ambulatory Visit (HOSPITAL_COMMUNITY): Admission: RE | Admit: 2022-01-02 | Payer: Medicare Other | Source: Ambulatory Visit

## 2022-01-02 DIAGNOSIS — K862 Cyst of pancreas: Secondary | ICD-10-CM

## 2022-01-02 DIAGNOSIS — D1803 Hemangioma of intra-abdominal structures: Secondary | ICD-10-CM

## 2022-01-02 DIAGNOSIS — R935 Abnormal findings on diagnostic imaging of other abdominal regions, including retroperitoneum: Secondary | ICD-10-CM

## 2022-01-25 DIAGNOSIS — G4733 Obstructive sleep apnea (adult) (pediatric): Secondary | ICD-10-CM | POA: Diagnosis not present

## 2022-02-19 ENCOUNTER — Ambulatory Visit: Payer: Medicare Other | Admitting: Gastroenterology

## 2022-02-24 DIAGNOSIS — G4733 Obstructive sleep apnea (adult) (pediatric): Secondary | ICD-10-CM | POA: Diagnosis not present

## 2022-03-06 DIAGNOSIS — E559 Vitamin D deficiency, unspecified: Secondary | ICD-10-CM | POA: Diagnosis not present

## 2022-03-06 DIAGNOSIS — Z136 Encounter for screening for cardiovascular disorders: Secondary | ICD-10-CM | POA: Diagnosis not present

## 2022-03-06 DIAGNOSIS — Z7182 Exercise counseling: Secondary | ICD-10-CM | POA: Diagnosis not present

## 2022-03-06 DIAGNOSIS — I10 Essential (primary) hypertension: Secondary | ICD-10-CM | POA: Diagnosis not present

## 2022-03-06 DIAGNOSIS — E785 Hyperlipidemia, unspecified: Secondary | ICD-10-CM | POA: Diagnosis not present

## 2022-03-06 DIAGNOSIS — Z713 Dietary counseling and surveillance: Secondary | ICD-10-CM | POA: Diagnosis not present

## 2022-03-06 DIAGNOSIS — G4733 Obstructive sleep apnea (adult) (pediatric): Secondary | ICD-10-CM | POA: Diagnosis not present

## 2022-03-06 DIAGNOSIS — M5136 Other intervertebral disc degeneration, lumbar region: Secondary | ICD-10-CM | POA: Diagnosis not present

## 2022-03-06 DIAGNOSIS — E119 Type 2 diabetes mellitus without complications: Secondary | ICD-10-CM | POA: Diagnosis not present

## 2022-03-06 DIAGNOSIS — M546 Pain in thoracic spine: Secondary | ICD-10-CM | POA: Diagnosis not present

## 2022-03-13 DIAGNOSIS — R5383 Other fatigue: Secondary | ICD-10-CM | POA: Diagnosis not present

## 2022-06-05 DIAGNOSIS — Z7182 Exercise counseling: Secondary | ICD-10-CM | POA: Diagnosis not present

## 2022-06-05 DIAGNOSIS — K219 Gastro-esophageal reflux disease without esophagitis: Secondary | ICD-10-CM | POA: Diagnosis not present

## 2022-06-05 DIAGNOSIS — I10 Essential (primary) hypertension: Secondary | ICD-10-CM | POA: Diagnosis not present

## 2022-06-05 DIAGNOSIS — G4733 Obstructive sleep apnea (adult) (pediatric): Secondary | ICD-10-CM | POA: Diagnosis not present

## 2022-06-05 DIAGNOSIS — J0100 Acute maxillary sinusitis, unspecified: Secondary | ICD-10-CM | POA: Diagnosis not present

## 2022-06-05 DIAGNOSIS — E559 Vitamin D deficiency, unspecified: Secondary | ICD-10-CM | POA: Diagnosis not present

## 2022-06-05 DIAGNOSIS — E785 Hyperlipidemia, unspecified: Secondary | ICD-10-CM | POA: Diagnosis not present

## 2022-06-05 DIAGNOSIS — M5136 Other intervertebral disc degeneration, lumbar region: Secondary | ICD-10-CM | POA: Diagnosis not present

## 2022-06-05 DIAGNOSIS — E119 Type 2 diabetes mellitus without complications: Secondary | ICD-10-CM | POA: Diagnosis not present

## 2022-06-05 DIAGNOSIS — Z713 Dietary counseling and surveillance: Secondary | ICD-10-CM | POA: Diagnosis not present

## 2022-06-05 DIAGNOSIS — R5382 Chronic fatigue, unspecified: Secondary | ICD-10-CM | POA: Diagnosis not present

## 2022-06-20 DIAGNOSIS — R519 Headache, unspecified: Secondary | ICD-10-CM | POA: Diagnosis not present

## 2022-06-20 DIAGNOSIS — R509 Fever, unspecified: Secondary | ICD-10-CM | POA: Diagnosis not present

## 2022-06-20 DIAGNOSIS — J029 Acute pharyngitis, unspecified: Secondary | ICD-10-CM | POA: Diagnosis not present

## 2022-06-20 DIAGNOSIS — R112 Nausea with vomiting, unspecified: Secondary | ICD-10-CM | POA: Diagnosis not present

## 2022-07-08 ENCOUNTER — Encounter: Payer: Self-pay | Admitting: *Deleted

## 2022-08-07 ENCOUNTER — Ambulatory Visit: Payer: Medicare Other | Admitting: Pulmonary Disease

## 2022-08-12 ENCOUNTER — Encounter: Payer: Self-pay | Admitting: Pulmonary Disease

## 2022-08-12 ENCOUNTER — Ambulatory Visit (INDEPENDENT_AMBULATORY_CARE_PROVIDER_SITE_OTHER): Payer: Medicare Other | Admitting: Pulmonary Disease

## 2022-08-12 VITALS — BP 120/78 | HR 71 | Ht 60.0 in | Wt 174.8 lb

## 2022-08-12 DIAGNOSIS — G4733 Obstructive sleep apnea (adult) (pediatric): Secondary | ICD-10-CM

## 2022-08-12 MED ORDER — MONTELUKAST SODIUM 10 MG PO TABS
10.0000 mg | ORAL_TABLET | Freq: Every day | ORAL | 5 refills | Status: DC
Start: 2022-08-12 — End: 2023-10-24

## 2022-08-12 NOTE — Patient Instructions (Addendum)
DME referral for a fullface mask  Will send in a prescription for Singulair to the pharmacy for you  Graded exercises as tolerated  Call with significant concerns  I will see you about 6 months from here

## 2022-08-12 NOTE — Progress Notes (Signed)
Kari [name]    [id number]    Nov 26, 1960  Primary Care Physician:Gobush, Lenis Dickinson, FNP  Referring Physician: Meda Coffee, FNP [address] [place],  [place] [address]  Chief complaint:   History of obstructive sleep apnea  HPI:  More tired and more sleepy during the day nowadays  Feels that the mask is not working well She does use a nasal mask  She feels her mouth hangs open a lot of times  She does get about 11 hours of sleep every night but still feels tired during the day She is somebody that is always needed long number of hours of sleep  She does have chronic back pain  Current machine is about a-year-old Last study was about 2015  Has gained some weight lately  Usually sleeps about 11 hours  Struggling with a lot of allergies lately  History of hypertension-well-controlled, history of diabetes-controlled.  History of allergies  Never smoker  Worked in nursing  Outpatient Encounter Medications as of 08/12/2022  Medication Sig   B Complex Vitamins (B COMPLEX PO) Take 1 tablet by mouth daily.   celecoxib (CELEBREX) 100 MG capsule Take 100 mg by mouth 2 (two) times daily.   cetirizine (ZYRTEC) 10 MG tablet Take 10 mg by mouth daily.   Cholecalciferol (VITAMIN D3) 1.25 MG (50000 UT) CAPS Take 50,000 Units by mouth daily.   citalopram (CELEXA) 40 MG tablet Take 40 mg by mouth daily.   Coenzyme Q10 (CO Q 10) 100 MG CAPS Take 100 mg by mouth daily.   ezetimibe (ZETIA) 10 MG tablet Take 1 tablet (10 mg total) by mouth daily. Overdue for Annual appt w/labs must see provider for future refills   fluticasone (FLONASE) 50 MCG/ACT nasal spray Place 1 spray into both nostrils daily.   gabapentin (NEURONTIN) 600 MG tablet Take 1.5 tablets by mouth daily.   gabapentin (NEURONTIN) 800 MG tablet Take 800 mg by mouth at bedtime.   ketotifen (ZADITOR) 0.025 % ophthalmic solution Place 1 drop into both eyes as needed (itching).   meloxicam  (MOBIC) 15 MG tablet Take 1 tablet (15 mg total) by mouth daily.   metFORMIN (GLUCOPHAGE) 1000 MG tablet TAKE 1 TABLET (1,000 MG TOTAL) BY MOUTH 2 (TWO) TIMES DAILY WITH A MEAL.   Multiple Vitamin (MULTIVITAMIN) tablet Take 1 tablet by mouth daily.   OVER THE COUNTER MEDICATION Take 1,000 mg by mouth 3 (three) times daily. Kratom 500 mg    Probiotic Product (DIGESTIVE ADV DIGESTIVE/IMMUNE PO) Take 1 capsule by mouth daily.   vitamin E 1000 UNIT capsule Take 1,000 Units by mouth daily.   No facility-administered encounter medications on file as of 08/12/2022.    Allergies as of 08/12/2022 - Review Complete 04/24/2021  Allergen Reaction Noted   Macrodantin [nitrofurantoin macrocrystal]  09/16/2011   Morphine and codeine Itching 09/16/2011   Macitentan  04/24/2021   Other  04/17/2020    Past Medical History:  Diagnosis Date   Allergy    SEASONAL   Arthritis    Asthma    "STATED RESOLVED"   Carpal tunnel syndrome 01/12/2021   Chronic kidney disease    LESION,STONE   Chronic tension-type headache, intractable 01/06/2018   Last Assessment & Plan:  Formatting of this note might be different from the original. Concern over persistent sinus infection. 1 year history of persistent facial pressure and pain radiating down into the teeth and gums.  Associated fatigue.  No major nasal  obstruction or colored rhinorrhea.  Much postnasal drainage in the throat sensation especially in the morning.  She is been treated with 4 an   Colitis    Encounter for cholecystectomy 04/24/2021   Fatigue 08/13/2013   Headache 08/31/2020   Hx of cervical spine surgery 04/24/2021   Hyperlipidemia associated with type 2 diabetes mellitus (HCC) 05/19/2020   Hypertension    Low back pain 08/13/2013   MDD (major depressive disorder), recurrent episode, moderate (HCC) 08/13/2013   OSA (obstructive sleep apnea)    Osteoarthritis 08/13/2013   Ovarian cyst 04/24/2021   Pancreatic lesion 12/29/2020   Thoracic  radiculopathy 04/24/2021   Type 2 diabetes mellitus with complication (HCC) 05/19/2020    Past Surgical History:  Procedure Laterality Date   CERVICAL FUSION     CESAREAN SECTION     x 3   CHOLECYSTECTOMY, LAPAROSCOPIC  2005    Family History  Problem Relation Age of Onset   Colon polyps Mother    Hypertension Mother    Hyperlipidemia Mother    Dementia Mother    Diabetes Mother    Hypertension Father    Heart attack Father    Kidney Stones Brother    Breast cancer Maternal Aunt    Leukemia Paternal Uncle    Kidney Stones Son    Kidney Stones Son    Colon cancer Neg Hx    Esophageal cancer Neg Hx    Rectal cancer Neg Hx    Stomach cancer Neg Hx     Social History   Socioeconomic History   Marital status: Divorced    Spouse name: Not on file   Number of children: 3   Years of education: Not on file   Highest education level: Associate degree: academic program  Occupational History   Occupation: retired Charity fundraiser  Tobacco Use   Smoking status: Never   Smokeless tobacco: Never  Vaping Use   Vaping Use: Never used  Substance and Sexual Activity   Alcohol use: Not Currently    Comment: none in 2 years   Drug use: Not Currently    Types: Marijuana    Comment: USE CBD GUMMIES BEARS   Sexual activity: Not Currently  Other Topics Concern   Not on file  Social History Narrative   Lives with her first husband's mother   Reports 3 grown sons, oldest son's live in [place]   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   Reports Associates degree in nursing   Divorced         Social Determinants of Health   Financial Resource Strain: Not on file  Food Insecurity: Not on file  Transportation Needs: Not on file  Physical Activity: Not on file  Stress: Not on file  Social Connections: Not on file  Intimate Partner Violence: Not on file    Review of Systems  Constitutional:  Negative for  fatigue.  Respiratory:  Positive for apnea. Negative for shortness of breath.   Psychiatric/Behavioral:  Positive for sleep disturbance.     There were no vitals filed for this visit.    Physical Exam Constitutional:      Appearance: She is obese.  HENT:     Head: Normocephalic.     Mouth/Throat:     Mouth: Mucous membranes are moist.     Comments: Mallampati 3, crowded oropharynx Eyes:     General: No  scleral icterus. Cardiovascular:     Rate and Rhythm: Normal rate and regular rhythm.     Heart sounds: No murmur heard.    No friction rub.  Pulmonary:     Effort: No respiratory distress.     Breath sounds: No stridor. No wheezing or rhonchi.  Musculoskeletal:     Cervical back: No rigidity or tenderness.  Neurological:     Mental Status: She is alert.  Psychiatric:        Mood and Affect: Mood normal.       04/27/2020    9:00 AM  Results of the Epworth flowsheet  Sitting and reading 1  Watching TV 1  Sitting, inactive in a public place (e.g. a theatre or a meeting) 0  As a passenger in a car for an hour without a break 0  Lying down to rest in the afternoon when circumstances permit 2  Sitting and talking to someone 0  Sitting quietly after a lunch without alcohol 0  In a car, while stopped for a few minutes in traffic 0  Total score 4    Data Reviewed: Study from 2015 was reviewed  Compliance data reviewed showing 100% compliance average use of 11 hours 15 minutes AutoSet 8-18 Residual AHI of 2.6  Assessment:  History of obstructive sleep apnea -Continues to use CPAP on a nightly basis  Continues to remain compliant with CPAP  Regarding long number of hours of sleep -Encouraged to try and initiate some sleep restriction  Regular exercises recommended  Significant symptoms related to allergies are present with nasal stuffiness congestion  Plan/Recommendations:  Continue CPAP nightly  Regular exercises encouraged  For allergies/asthma -Add  Singulair  Encouraged to call with significant concerns  Tentative follow-up in 6 months  Virl Diamond MD [facility] 08/12/2022, 10:41 AM  CC: Meda Coffee, FNP

## 2022-08-26 DIAGNOSIS — G4733 Obstructive sleep apnea (adult) (pediatric): Secondary | ICD-10-CM | POA: Diagnosis not present

## 2022-09-10 DIAGNOSIS — E785 Hyperlipidemia, unspecified: Secondary | ICD-10-CM | POA: Diagnosis not present

## 2022-09-10 DIAGNOSIS — R5382 Chronic fatigue, unspecified: Secondary | ICD-10-CM | POA: Diagnosis not present

## 2022-09-10 DIAGNOSIS — K219 Gastro-esophageal reflux disease without esophagitis: Secondary | ICD-10-CM | POA: Diagnosis not present

## 2022-09-10 DIAGNOSIS — E119 Type 2 diabetes mellitus without complications: Secondary | ICD-10-CM | POA: Diagnosis not present

## 2022-09-10 DIAGNOSIS — Z713 Dietary counseling and surveillance: Secondary | ICD-10-CM | POA: Diagnosis not present

## 2022-09-10 DIAGNOSIS — M5136 Other intervertebral disc degeneration, lumbar region: Secondary | ICD-10-CM | POA: Diagnosis not present

## 2022-09-10 DIAGNOSIS — Z7182 Exercise counseling: Secondary | ICD-10-CM | POA: Diagnosis not present

## 2022-09-10 DIAGNOSIS — I10 Essential (primary) hypertension: Secondary | ICD-10-CM | POA: Diagnosis not present

## 2023-01-06 DIAGNOSIS — R519 Headache, unspecified: Secondary | ICD-10-CM | POA: Diagnosis not present

## 2023-01-06 DIAGNOSIS — I10 Essential (primary) hypertension: Secondary | ICD-10-CM | POA: Diagnosis not present

## 2023-01-06 DIAGNOSIS — K0889 Other specified disorders of teeth and supporting structures: Secondary | ICD-10-CM | POA: Diagnosis not present

## 2023-01-28 DIAGNOSIS — M25512 Pain in left shoulder: Secondary | ICD-10-CM | POA: Diagnosis not present

## 2023-01-28 DIAGNOSIS — M546 Pain in thoracic spine: Secondary | ICD-10-CM | POA: Diagnosis not present

## 2023-01-28 DIAGNOSIS — K219 Gastro-esophageal reflux disease without esophagitis: Secondary | ICD-10-CM | POA: Diagnosis not present

## 2023-01-28 DIAGNOSIS — E559 Vitamin D deficiency, unspecified: Secondary | ICD-10-CM | POA: Diagnosis not present

## 2023-01-28 DIAGNOSIS — Z23 Encounter for immunization: Secondary | ICD-10-CM | POA: Diagnosis not present

## 2023-01-28 DIAGNOSIS — M25511 Pain in right shoulder: Secondary | ICD-10-CM | POA: Diagnosis not present

## 2023-01-28 DIAGNOSIS — E119 Type 2 diabetes mellitus without complications: Secondary | ICD-10-CM | POA: Diagnosis not present

## 2023-01-28 DIAGNOSIS — Z841 Family history of disorders of kidney and ureter: Secondary | ICD-10-CM | POA: Diagnosis not present

## 2023-01-28 DIAGNOSIS — E785 Hyperlipidemia, unspecified: Secondary | ICD-10-CM | POA: Diagnosis not present

## 2023-01-28 DIAGNOSIS — G8929 Other chronic pain: Secondary | ICD-10-CM | POA: Diagnosis not present

## 2023-02-27 DIAGNOSIS — M546 Pain in thoracic spine: Secondary | ICD-10-CM | POA: Diagnosis not present

## 2023-02-27 DIAGNOSIS — E119 Type 2 diabetes mellitus without complications: Secondary | ICD-10-CM | POA: Diagnosis not present

## 2023-02-27 DIAGNOSIS — G8929 Other chronic pain: Secondary | ICD-10-CM | POA: Diagnosis not present

## 2023-02-27 DIAGNOSIS — R601 Generalized edema: Secondary | ICD-10-CM | POA: Diagnosis not present

## 2023-03-09 ENCOUNTER — Ambulatory Visit (HOSPITAL_BASED_OUTPATIENT_CLINIC_OR_DEPARTMENT_OTHER)
Admission: RE | Admit: 2023-03-09 | Discharge: 2023-03-09 | Disposition: A | Discharge status: Home / Self Care | Payer: Medicare Other | Source: Ambulatory Visit

## 2023-03-09 ENCOUNTER — Encounter (HOSPITAL_BASED_OUTPATIENT_CLINIC_OR_DEPARTMENT_OTHER): Payer: Self-pay

## 2023-03-09 VITALS — BP 104/70 | HR 109 | Temp 99.3°F | Resp 18

## 2023-03-09 DIAGNOSIS — J019 Acute sinusitis, unspecified: Secondary | ICD-10-CM

## 2023-03-09 DIAGNOSIS — R112 Nausea with vomiting, unspecified: Secondary | ICD-10-CM | POA: Diagnosis not present

## 2023-03-09 DIAGNOSIS — B9689 Other specified bacterial agents as the cause of diseases classified elsewhere: Secondary | ICD-10-CM

## 2023-03-09 MED ORDER — ONDANSETRON HCL 4 MG PO TABS: 4.0000 mg | ORAL_TABLET | Freq: Three times a day (TID) | ORAL | 0 refills | Status: DC | PRN

## 2023-03-09 MED ORDER — BENZONATATE 100 MG PO CAPS: 100.0000 mg | ORAL_CAPSULE | Freq: Three times a day (TID) | ORAL | 0 refills | Status: DC

## 2023-03-09 MED ORDER — ONDANSETRON 4 MG PO TBDP
4.0000 mg | ORAL_TABLET | Freq: Once | ORAL | Status: AC
  Administered 2023-03-09: 4 mg via ORAL

## 2023-03-09 MED ORDER — AMOXICILLIN-POT CLAVULANATE 875-125 MG PO TABS: 1.0000 | ORAL_TABLET | Freq: Two times a day (BID) | ORAL | 0 refills | Status: DC

## 2023-03-09 NOTE — Discharge Instructions (Signed)
Take antibiotic as prescribed Can take Tessalon as needed for cough Recommend Mucinex and Flonase for congestion Can take Zofran as needed for nausea Rest, drink plenty of fluids Return if you develop new or worsening symptoms

## 2023-03-09 NOTE — ED Triage Notes (Signed)
Pt c/o chest congestion, nasal congestion, cough, headache that has been ongoing since last weekend. Saw a doctor that week and told doctor that can't breathe through the nose and asked patient when last saw her ENT which was before Covid. Pt states that today also vomiting and unable to keep medications. Took tylenol. Took Theraflu but throat was too sore so couldn't take all of it.

## 2023-03-09 NOTE — ED Provider Notes (Signed)
[facility]    CSN: [id number] Arrival date & time: 03/09/23  4696      History   Chief Complaint Chief Complaint  Patient presents with   Cough    Fever - Entered by patient   Nasal Congestion   Emesis    HPI Kari [name] is a 62 y.o. female.   Patient complains of congestion, sinus pain and pressure, cough, headache that started about a week ago.  She reports over the last 2 days experiencing nausea and vomiting.  She reports unable to take any of her medications today due to nausea.  She complains of sore throat, postnasal drip, body aches.  Patient denies wheezing, shortness of breath, fever. Has taken theraflu and tylenol.  Tylenol helps for about three hours.  Complains of sore throat.  Hasn't taken her regular meds today due to nausea.  Reports vomiting yesterday.      Past Medical History:  Diagnosis Date   Allergy    SEASONAL   Arthritis    Asthma    "STATED RESOLVED"   Carpal tunnel syndrome 01/12/2021   Chronic kidney disease    LESION,STONE   Chronic tension-type headache, intractable 01/06/2018   Last Assessment & Plan:  Formatting of this note might be different from the original. Concern over persistent sinus infection. 1 year history of persistent facial pressure and pain radiating down into the teeth and gums.  Associated fatigue.  No major nasal obstruction or colored rhinorrhea.  Much postnasal drainage in the throat sensation especially in the morning.  She is been treated with 4 an   Colitis    Encounter for cholecystectomy 04/24/2021   Fatigue 08/13/2013   Headache 08/31/2020   Hx of cervical spine surgery 04/24/2021   Hyperlipidemia associated with type 2 diabetes mellitus (HCC) 05/19/2020   Hypertension    Low back pain 08/13/2013   MDD (major depressive disorder), recurrent episode, moderate (HCC) 08/13/2013   OSA (obstructive sleep apnea)    Osteoarthritis 08/13/2013   Ovarian cyst 04/24/2021   Pancreatic  lesion 12/29/2020   Thoracic radiculopathy 04/24/2021   Type 2 diabetes mellitus with complication (HCC) 05/19/2020    Patient Active Problem List   Diagnosis Date Noted   Allergy 04/27/2021   Arthritis 04/27/2021   Asthma 04/27/2021   Chronic kidney disease 04/27/2021   Colitis 04/27/2021   Hypertension 04/27/2021   Encounter for cholecystectomy 04/24/2021   Hx of cervical spine surgery 04/24/2021   Ovarian cyst 04/24/2021   Thoracic radiculopathy 04/24/2021   Carpal tunnel syndrome 01/12/2021   Pancreatic lesion 12/29/2020   Headache 08/31/2020   Type 2 diabetes mellitus with complication (HCC) 05/19/2020   Hyperlipidemia associated with type 2 diabetes mellitus (HCC) 05/19/2020   Chronic tension-type headache, intractable 01/06/2018   OSA (obstructive sleep apnea) 08/31/2013   Fatigue 08/13/2013   Low back pain 08/13/2013   Osteoarthritis 08/13/2013   MDD (major depressive disorder), recurrent episode, moderate (HCC) 08/13/2013    Past Surgical History:  Procedure Laterality Date   CERVICAL FUSION     CESAREAN SECTION     x 3   CHOLECYSTECTOMY, LAPAROSCOPIC  2005    OB History   No obstetric history on file.      Home Medications    Prior to Admission medications   Medication Sig Start Date End Date Taking? Authorizing Provider  amoxicillin-clavulanate (AUGMENTIN) 875-125 MG tablet Take 1 tablet by mouth every 12 (twelve) hours. 03/09/23  Yes Ward, Tylene Fantasia, PA-C  benzonatate (TESSALON) 100 MG capsule Take 1 capsule (100 mg total) by mouth every 8 (eight) hours. 03/09/23  Yes Ward, Tylene Fantasia, PA-C  citalopram (CELEXA) 40 MG tablet Take 40 mg by mouth daily. 01/12/23  Yes [provider]  ondansetron (ZOFRAN) 4 MG tablet Take 1 tablet (4 mg total) by mouth every 8 (eight) hours as needed for nausea or vomiting. 03/09/23  Yes Ward, Tylene Fantasia, PA-C  B Complex Vitamins (B COMPLEX PO) Take 1 tablet by mouth daily.    [provider]  celecoxib  (CELEBREX) 100 MG capsule Take 100 mg by mouth 2 (two) times daily. 04/23/21   [provider]  cetirizine (ZYRTEC) 10 MG tablet Take 10 mg by mouth daily.    [provider]  Cholecalciferol (VITAMIN D3) 1.25 MG (50000 UT) CAPS Take 50,000 Units by mouth daily.    [provider]  Coenzyme Q10 (CO Q 10) 100 MG CAPS Take 100 mg by mouth daily.    [provider]  ezetimibe (ZETIA) 10 MG tablet Take 1 tablet (10 mg total) by mouth daily. Overdue for Annual appt w/labs must see provider for future refills 11/02/21   Myrlene Broker, MD  fluticasone [facility]) 50 MCG/ACT nasal spray Place 1 spray into both nostrils daily.    [provider]  furosemide (LASIX) 20 MG tablet Take 1 tablet by mouth daily. 08/02/22   [provider]  gabapentin (NEURONTIN) 600 MG tablet Take 1.5 tablets by mouth daily. 05/28/21   [provider]  hydrochlorothiazide (HYDRODIURIL) 25 MG tablet Take by mouth. 07/05/16   [provider]  ketotifen (ZADITOR) 0.025 % ophthalmic solution Place 1 drop into both eyes as needed (itching).    [provider]  meloxicam (MOBIC) 15 MG tablet Take 1 tablet (15 mg total) by mouth daily. 08/28/20   Myrlene Broker, MD  metFORMIN (GLUCOPHAGE) 1000 MG tablet TAKE 1 TABLET (1,000 MG TOTAL) BY MOUTH 2 (TWO) TIMES DAILY WITH A MEAL. 09/04/21   Myrlene Broker, MD  montelukast (SINGULAIR) 10 MG tablet Take 1 tablet (10 mg total) by mouth at bedtime. 08/12/22   Tomma Lightning, MD  Multiple Vitamin (MULTIVITAMIN) tablet Take 1 tablet by mouth daily.    [provider]  omeprazole (PRILOSEC) 40 MG capsule Take by mouth. 10/25/21   [provider]  OVER THE COUNTER MEDICATION Take 1,000 mg by mouth 3 (three) times daily. Kratom 500 mg     [provider]  Probiotic Product (DIGESTIVE ADV DIGESTIVE/IMMUNE PO) Take 1 capsule by mouth daily.    [provider]  vitamin E 1000  UNIT capsule Take 1,000 Units by mouth daily.    [provider]    Family History Family History  Problem Relation Age of Onset   Colon polyps Mother    Hypertension Mother    Hyperlipidemia Mother    Dementia Mother    Diabetes Mother    Hypertension Father    Heart attack Father    Kidney Stones Brother    Breast cancer Maternal Aunt    Leukemia Paternal Uncle    Kidney Stones Son    Kidney Stones Son    Colon cancer Neg Hx    Esophageal cancer Neg Hx    Rectal cancer Neg Hx    Stomach cancer Neg Hx     Social History Social History   Tobacco Use   Smoking status: Never   Smokeless tobacco: Never  Vaping Use  Vaping status: Never Used  Substance Use Topics   Alcohol use: Not Currently    Comment: none in 2 years   Drug use: Not Currently    Types: Marijuana    Comment: USE CBD GUMMIES BEARS     Allergies   Iodinated contrast media, Macrodantin [nitrofurantoin macrocrystal], Morphine and codeine, Macitentan, and Other   Review of Systems Review of Systems  Constitutional:  Negative for chills and fever.  HENT:  Positive for congestion and sore throat. Negative for ear pain.   Eyes:  Negative for pain and visual disturbance.  Respiratory:  Positive for cough. Negative for shortness of breath.   Cardiovascular:  Negative for chest pain and palpitations.  Gastrointestinal:  Positive for nausea and vomiting. Negative for abdominal pain.  Genitourinary:  Negative for dysuria and hematuria.  Musculoskeletal:  Positive for myalgias. Negative for arthralgias and back pain.  Skin:  Negative for color change and rash.  Neurological:  Negative for seizures and syncope.  All other systems reviewed and are negative.    Physical Exam Triage Vital Signs ED Triage Vitals  Encounter Vitals Group     BP 03/09/23 0936 104/70     Systolic BP Percentile --      Diastolic BP Percentile --      Pulse Rate 03/09/23 0936 (!) 109     Resp 03/09/23 0936 18      Temp 03/09/23 0936 99.3 F (37.4 C)     Temp Source 03/09/23 0936 Oral     SpO2 03/09/23 0936 94 %     Weight --      Height --      Head Circumference --      Peak Flow --      Pain Score 03/09/23 0934 10     Pain Loc --      Pain Education --      Exclude from Growth Chart --    No data found.  Updated Vital Signs BP 104/70 (BP Location: Right Arm)   Pulse (!) 109   Temp 99.3 F (37.4 C) (Oral)   Resp 18   LMP 03/25/2008   SpO2 94%   Visual Acuity Right Eye Distance:   Left Eye Distance:   Bilateral Distance:    Right Eye Near:   Left Eye Near:    Bilateral Near:     Physical Exam Vitals and nursing note reviewed.  Constitutional:      General: She is not in acute distress.    Appearance: She is well-developed.  HENT:     Head: Normocephalic and atraumatic.  Eyes:     Conjunctiva/sclera: Conjunctivae normal.  Cardiovascular:     Rate and Rhythm: Normal rate and regular rhythm.     Heart sounds: No murmur heard. Pulmonary:     Effort: Pulmonary effort is normal. No respiratory distress.     Breath sounds: Normal breath sounds.  Abdominal:     Palpations: Abdomen is soft.     Tenderness: There is no abdominal tenderness.  Musculoskeletal:        General: No swelling.     Cervical back: Neck supple.  Skin:    General: Skin is warm and dry.     Capillary Refill: Capillary refill takes less than 2 seconds.  Neurological:     Mental Status: She is alert.  Psychiatric:        Mood and Affect: Mood normal.      UC Treatments / Results  Labs (all  labs ordered are listed, but only abnormal results are displayed) Labs Reviewed - No data to display  EKG   Radiology No results found.  Procedures Procedures (including critical care time)  Medications Ordered in UC Medications  ondansetron (ZOFRAN-ODT) disintegrating tablet 4 mg (4 mg Oral Given 03/09/23 0951)    Initial Impression / Assessment and Plan / UC Course  I have reviewed the triage  vital signs and the nursing notes.  Pertinent labs & imaging results that were available during my care of the patient were reviewed by me and considered in my medical decision making (see chart for details).     Will treat for acute bacterial sinusitis given greater than a week of sinus pain and pressure, cough, congestion.  Antibiotic prescribed.  Tessalon prescribed.  Lungs clear to auscultation.  Patient given Zofran in clinic today.  She reports some improvement of nausea after taking Zofran.  Will send in prescription for Zofran to have on hand as needed.  Patient in no acute distress, eager to go home, return precautions discussed. Final Clinical Impressions(s) / UC Diagnoses   Final diagnoses:  Acute bacterial sinusitis  Nausea and vomiting, unspecified vomiting type     Discharge Instructions      Take antibiotic as prescribed Can take Tessalon as needed for cough Recommend Mucinex and Flonase for congestion Can take Zofran as needed for nausea Rest, drink plenty of fluids Return if you develop new or worsening symptoms      ED Prescriptions     Medication Sig Dispense Auth. Provider   amoxicillin-clavulanate (AUGMENTIN) 875-125 MG tablet Take 1 tablet by mouth every 12 (twelve) hours. 14 tablet Ward, Shanda Bumps Z, PA-C   ondansetron (ZOFRAN) 4 MG tablet Take 1 tablet (4 mg total) by mouth every 8 (eight) hours as needed for nausea or vomiting. 20 tablet Ward, Shanda Bumps Z, PA-C   benzonatate (TESSALON) 100 MG capsule Take 1 capsule (100 mg total) by mouth every 8 (eight) hours. 21 capsule Ward, Tylene Fantasia, PA-C      PDMP not reviewed this encounter.   Ward, Tylene Fantasia, PA-C 03/09/23 1031

## 2023-04-22 ENCOUNTER — Ambulatory Visit: Payer: Medicare Other | Admitting: Family Medicine

## 2023-04-30 DIAGNOSIS — E119 Type 2 diabetes mellitus without complications: Secondary | ICD-10-CM | POA: Diagnosis not present

## 2023-04-30 DIAGNOSIS — H5213 Myopia, bilateral: Secondary | ICD-10-CM | POA: Diagnosis not present

## 2023-06-25 IMAGING — MR MR ABDOMEN WO/W CM MRCP
17 of 20 series · 40 of 48 positions shown · IV contrast (7ml GADAVIST)
Comparison: MRI March 14, 2020

CLINICAL DATA: Follow-up cystic pancreatic lesion.

EXAM:
MRI ABDOMEN WITHOUT AND WITH CONTRAST (INCLUDING MRCP)
TECHNIQUE: Multiplanar multisequence MR imaging of the abdomen was performed
both before and after the administration of intravenous contrast.
Heavily T2-weighted images of the biliary and pancreatic ducts were
obtained, and three-dimensional MRCP images were rendered by post
processing.
CONTRAST:  7mL GADAVIST GADOBUTROL 1 MMOL/ML IV SOLN

[Series 2: DWI · axial · 6.0mm · 1.49mm/px · z∈[-178,+74]mm · 3 of 72 slices shown (1 of 2)]
[im 1/72]
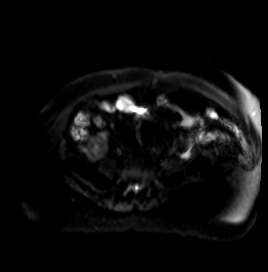
[im 36/72]
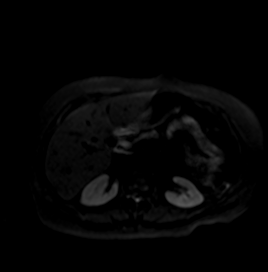
[im 72/72]
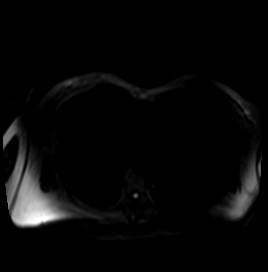

[Series 3: DWI · axial · 6.0mm · 1.49mm/px · 1 of 36 slices shown (2 of 2)]
[im 1/36]
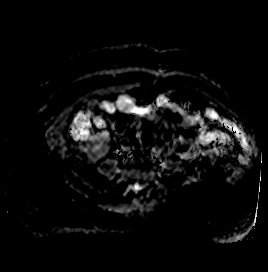

[Series 4: T2 fat-sat · axial · 6.0mm · 1.25mm/px · 1 of 39 slices shown]
[im 1/39]
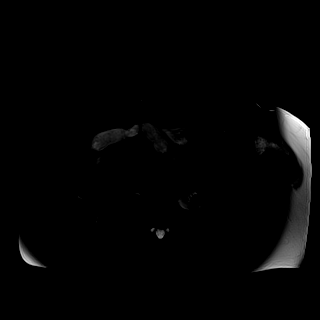

[Series 8: cor_3d_spc_trig · coronal · 1.0mm · 0.49mm/px · 3 of 86 slices shown]
[im 1/86]
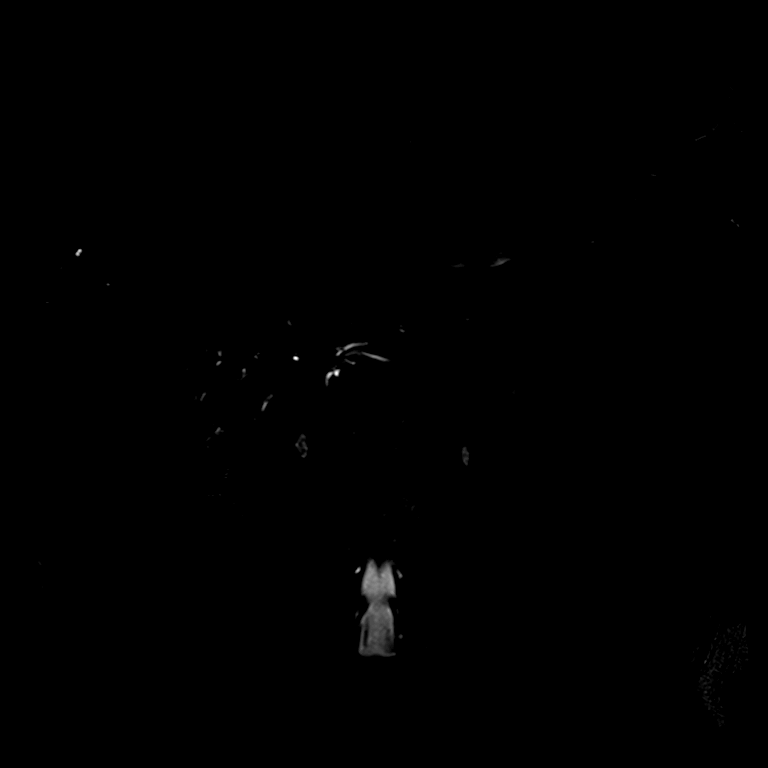
[im 43/86]
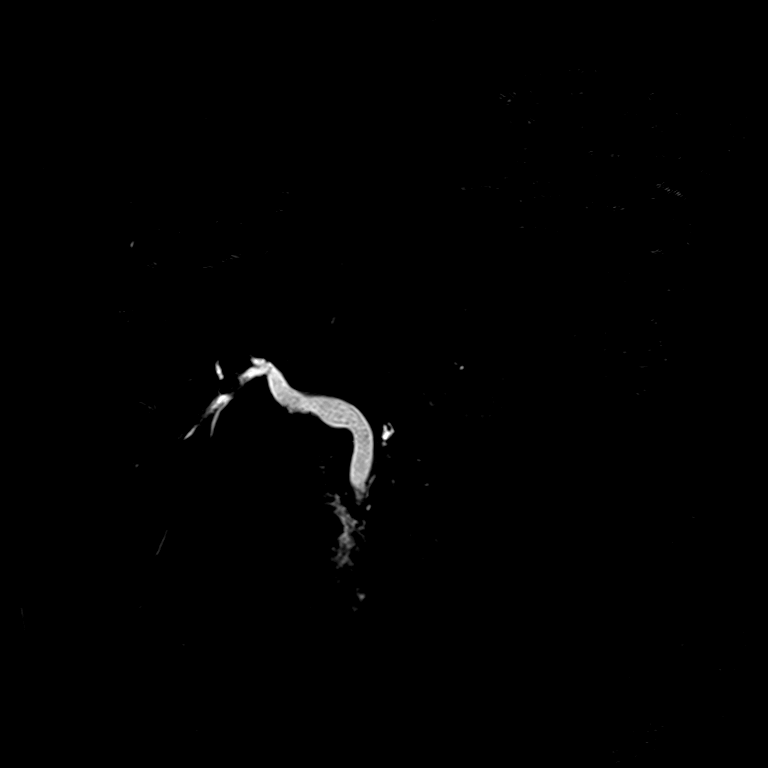
[im 86/86]
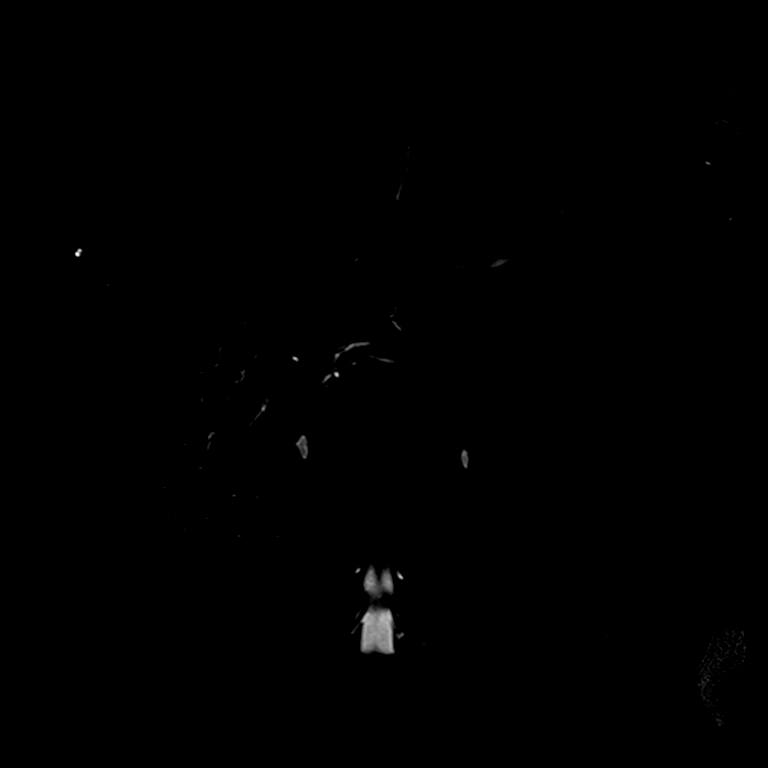

[Series 12: T2 · coronal · 6.0mm · 1.48mm/px · 1 of 34 slices shown (1 of 2)]
[im 1/34]
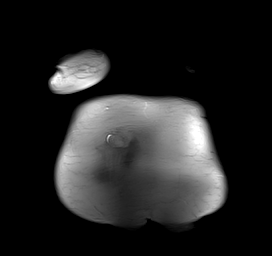

[Series 13: T1 · axial · 3.0mm · 1.25mm/px · z∈[-214,+47]mm · 3 of 88 slices shown (1 of 2)]
[im 1/88]
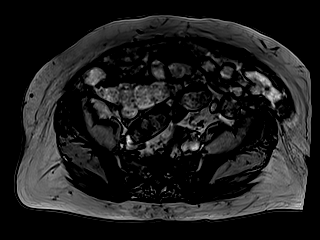
[im 44/88]
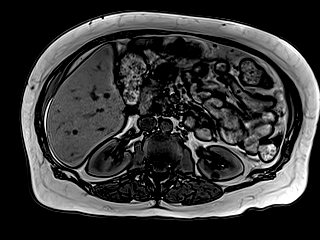
[im 88/88]
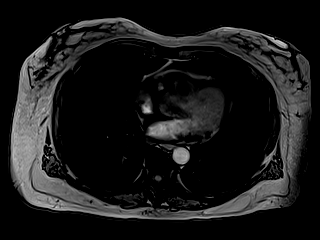

[Series 14: T1 · axial · 3.0mm · 1.25mm/px · z∈[-214,+47]mm · 3 of 88 slices shown (2 of 2)]
[im 1/88]
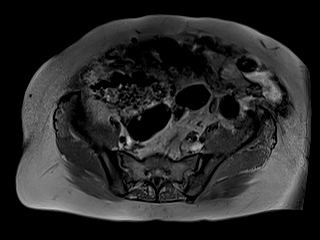
[im 44/88]
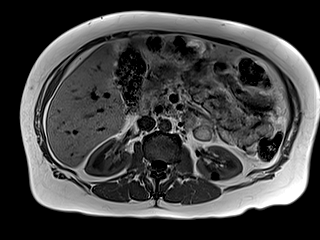
[im 88/88]
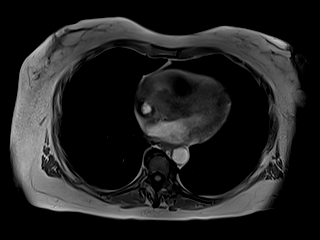

[Series 15: cor obl thk · sagittal · 50.0mm · 0.78mm/px · 1 of 7 slices shown]
[im 1/7]
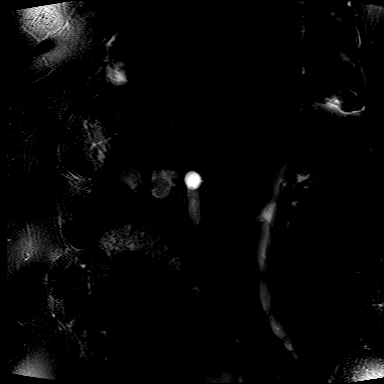

[Series 17: T2 · axial · 6.0mm · 1.56mm/px · 1 of 35 slices shown (2 of 2)]
[im 1/35]
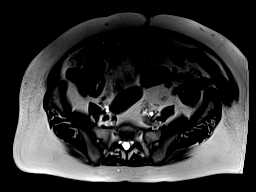

[Series 19: T1 dynamic · axial · 3.0mm · 1.25mm/px · z∈[-219,+42]mm · 3 of 88 slices shown (1 of 8)]
[im 1/88]
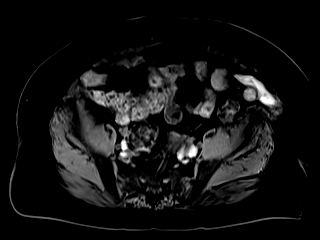
[im 44/88]
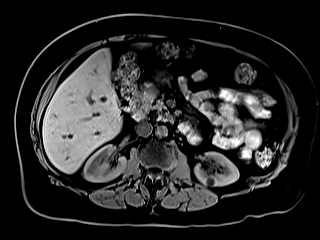
[im 88/88]
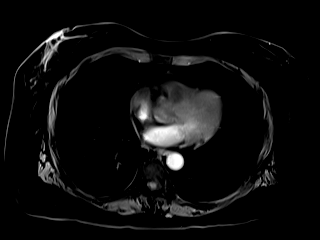

[Series 23: T1 dynamic · axial · 3.0mm · 1.25mm/px · z∈[-219,+42]mm · 3 of 88 slices shown (2 of 8)]
[im 1/88]
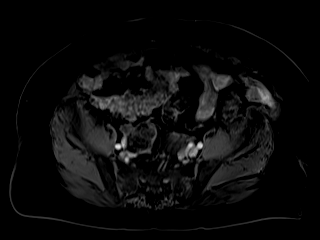
[im 44/88]
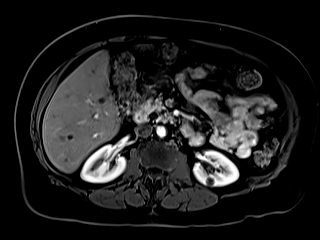
[im 88/88]
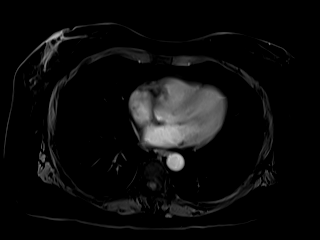

[Series 24: T1 dynamic · axial · 3.0mm · 1.25mm/px · z∈[-219,+42]mm · 3 of 88 slices shown (3 of 8)]
[im 1/88]
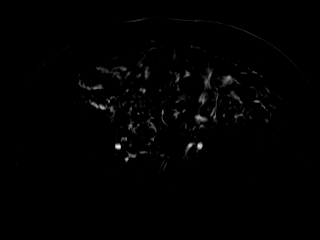
[im 44/88]
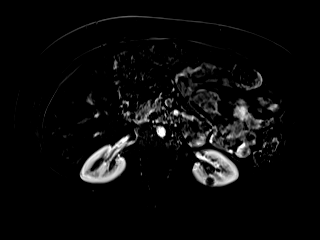
[im 88/88]
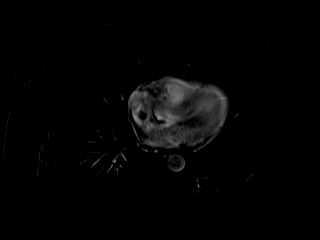

[Series 27: T1 dynamic · axial · 3.0mm · 1.25mm/px · z∈[-219,+42]mm · 3 of 88 slices shown (4 of 8)]
[im 1/88]
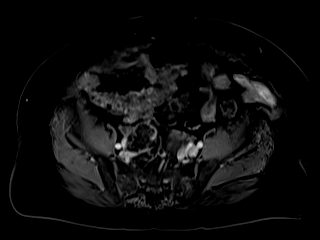
[im 44/88]
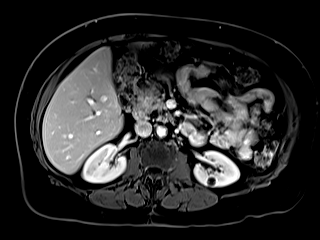
[im 88/88]
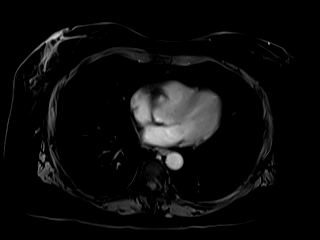

[Series 28: T1 dynamic · axial · 3.0mm · 1.25mm/px · z∈[-219,+42]mm · 3 of 88 slices shown (5 of 8)]
[im 1/88]
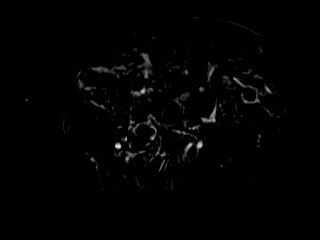
[im 44/88]
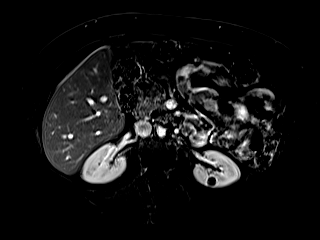
[im 88/88]
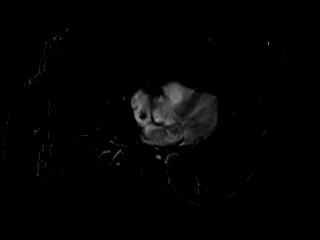

[Series 31: T1 dynamic · axial · 3.0mm · 1.25mm/px · z∈[-219,+42]mm · 3 of 88 slices shown (6 of 8)]
[im 1/88]
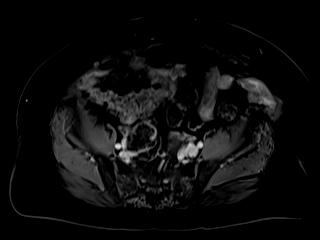
[im 44/88]
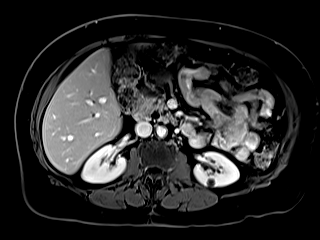
[im 88/88]
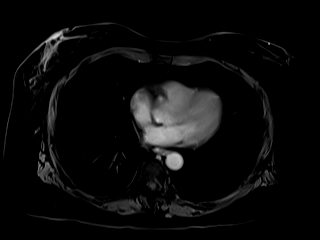

[Series 32: T1 dynamic · axial · 3.0mm · 1.25mm/px · z∈[-219,+42]mm · 3 of 88 slices shown (7 of 8)]
[im 1/88]
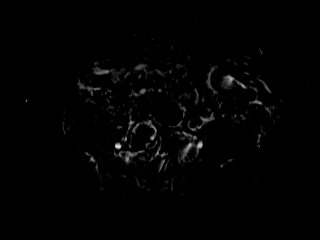
[im 44/88]
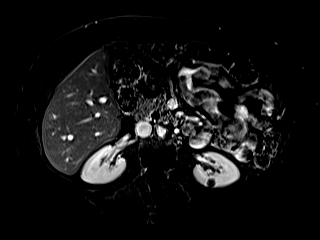
[im 88/88]
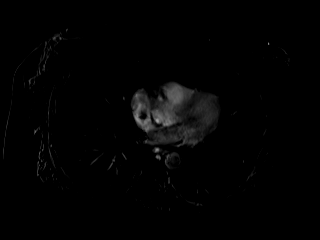

[Series 34: T1 dynamic · coronal · 3.0mm · 1.41mm/px · 2 of 72 slices shown (8 of 8)]
[im 1/72]
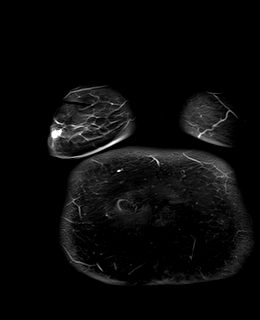
[im 72/72]
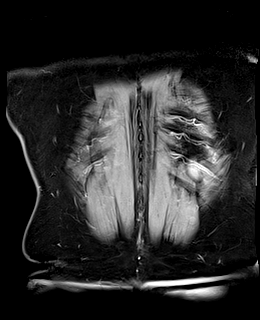

[40 of 48 positions shown; findings below may reference images not displayed]

FINDINGS: Lower chest: No acute abnormality.

Hepatobiliary: No hepatic steatosis. Well-circumscribed 2.1 cm T2
hyperintense enhancing lesion in the posterior right hepatic lobe on
image [DATE] since distant with a benign hepatic hemangioma.
Gallbladder surgically absent. Similar mild prominence of the intra
and extrahepatic biliary tree favored reservoir effect post
cholecystectomy.

Pancreas: Unchanged size of the 5 mm cystic lesion in the pancreatic
body on image [DATE]. No suspicious postcontrast enhancement. No
pancreatic ductal dilation. No additional/new pancreatic lesions
visualized.

Spleen:  Within normal limits.

Adrenals/Urinary Tract: Bilateral adrenal glands are unremarkable.
No hydronephrosis. Well-circumscribed T2 hyperintense left
interpolar renal lesion with a thin enhancing septation, consistent
with a benign Bosniak classification 2 renal cysts. No solid
enhancing renal mass.

Stomach/Bowel: Visualized portions within the abdomen are
unremarkable.

Vascular/Lymphatic: No pathologically enlarged abdominal lymph
nodes. No abdominal aortic aneurysm.

Other:  No abdominal ascites.

Musculoskeletal: No suspicious bone lesions identified.
IMPRESSION: 1. Unchanged size of the 5 mm cystic lesion in the pancreatic body,
without suspicious MRI features most consistent with a side branch
IPMN. Recommend follow up pre and post contrast MRI/MRCP 1 year.
This recommendation follows ACR consensus guidelines: Management of
Incidental Pancreatic Cysts: A White Paper of the ACR Incidental
Findings Committee. [HOSPITAL] 3797;[DATE].
2. Benign 2.1 cm hepatic hemangioma in the posterior right hepatic
lobe.

## 2023-07-03 ENCOUNTER — Telehealth: Payer: Self-pay

## 2023-07-03 DIAGNOSIS — G4733 Obstructive sleep apnea (adult) (pediatric): Secondary | ICD-10-CM

## 2023-07-03 NOTE — Telephone Encounter (Signed)
 Copied from CRM [id number]. Topic: Clinical - Prescription Issue >> Jul 03, 2023  2:21 PM Theodis Sato wrote: Reason for CRM: Patient states she is completely out of her CPAP supplies, and the "[employer]" distributor that she was picking up her supplies at is no longer accepted by her insurance. Patient is requesting Dr. Trena Platt nurse to help her figure out where her insurance will cover.  ATC X1. Lmtcb. Pt would need to call her insurance company to see what is covered.

## 2023-07-08 NOTE — Telephone Encounter (Signed)
 [employer] now uses [employer] for DME orders.  New order for CPAP supplies placed with [facility].   VM left for patient with contact info for [facility].

## 2023-07-08 NOTE — Telephone Encounter (Signed)
 Copied from CRM [id number]. Topic: General - Other >> Jul 07, 2023  1:13 PM Eveleen Hinds B wrote: Reason for CRM: Would like to talk with Kari [name]. Patient feels like she is having apnea, and in need of a new mask.  Please call.

## 2023-07-17 ENCOUNTER — Telehealth: Payer: Self-pay | Admitting: Pulmonary Disease

## 2023-07-17 NOTE — Telephone Encounter (Signed)
 Order for CPAP supplies signed and faxed successfully 07/17/23

## 2023-07-30 NOTE — Telephone Encounter (Signed)
 Patient received a statement from [employer] on 07/24/23. The order was transferred over to [employer]. Was told that the patient can call [employer] and receive the tracking information and status. I'll call the patient to let them know that they can callback to get the status of their supplies.

## 2023-07-30 NOTE — Telephone Encounter (Signed)
 Copied from CRM [id number]. Topic: Clinical - Prescription Issue >> Jul 23, 2023  3:47 PM Margarette Shawl wrote: Reason for CRM:   Pt is calling in regards to her DME order with [facility]. She is frustrated with Gen[facility] Kennel and feels she is getting the run around due to still waiting on her supplies and is being told it could be a few more weeks till they arrive. Having issues with her mask, that is causing the machine to shut off randomly throughout the night. She is requesting if someone could reach out to the company to see if the process will move faster.   CB#  [phone]   Can one of you check on this?

## 2023-09-01 ENCOUNTER — Telehealth: Payer: Self-pay

## 2023-09-01 NOTE — Telephone Encounter (Signed)
 Lm for patient to request that she brings SD card to 09/02/2023 visit.

## 2023-09-01 NOTE — Telephone Encounter (Signed)
 Copied from CRM [id number]. Topic: General - Other >> Sep 01, 2023 11:03 AM Margarette Shawl wrote: Reason for CRM:   Pt returning call from [facility]. Reviewed chart and advised Sammi Crick was reaching out to request she bring her SD card to appt on 06/10. She reports she is unsure if she has an SD card in the  CPAP machine.   Requesting call back  # [phone]  Spoke with patient regarding prior message . Patient stated she does not have a SIM card in her CPAP machine . Advised patient I will try to call [employer] to get a download.Patient's voice was understanding.

## 2023-09-02 ENCOUNTER — Ambulatory Visit: Admitting: Pulmonary Disease

## 2023-10-24 ENCOUNTER — Ambulatory Visit (HOSPITAL_BASED_OUTPATIENT_CLINIC_OR_DEPARTMENT_OTHER): Admitting: Student

## 2023-10-24 ENCOUNTER — Encounter (HOSPITAL_BASED_OUTPATIENT_CLINIC_OR_DEPARTMENT_OTHER): Payer: Self-pay | Admitting: Student

## 2023-10-24 VITALS — BP 163/88 | HR 64 | Temp 98.4°F | Resp 16 | Ht 59.84 in | Wt 179.3 lb

## 2023-10-24 DIAGNOSIS — E559 Vitamin D deficiency, unspecified: Secondary | ICD-10-CM | POA: Diagnosis not present

## 2023-10-24 DIAGNOSIS — E118 Type 2 diabetes mellitus with unspecified complications: Secondary | ICD-10-CM | POA: Diagnosis not present

## 2023-10-24 DIAGNOSIS — E785 Hyperlipidemia, unspecified: Secondary | ICD-10-CM | POA: Diagnosis not present

## 2023-10-24 DIAGNOSIS — R109 Unspecified abdominal pain: Secondary | ICD-10-CM | POA: Diagnosis not present

## 2023-10-24 DIAGNOSIS — F331 Major depressive disorder, recurrent, moderate: Secondary | ICD-10-CM

## 2023-10-24 DIAGNOSIS — I10 Essential (primary) hypertension: Secondary | ICD-10-CM | POA: Diagnosis not present

## 2023-10-24 DIAGNOSIS — Z7689 Persons encountering health services in other specified circumstances: Secondary | ICD-10-CM | POA: Diagnosis not present

## 2023-10-24 DIAGNOSIS — G4733 Obstructive sleep apnea (adult) (pediatric): Secondary | ICD-10-CM | POA: Diagnosis not present

## 2023-10-24 DIAGNOSIS — R0789 Other chest pain: Secondary | ICD-10-CM

## 2023-10-24 DIAGNOSIS — Z791 Long term (current) use of non-steroidal anti-inflammatories (NSAID): Secondary | ICD-10-CM | POA: Diagnosis not present

## 2023-10-24 DIAGNOSIS — Z59819 Housing instability, housed unspecified: Secondary | ICD-10-CM

## 2023-10-24 DIAGNOSIS — E1169 Type 2 diabetes mellitus with other specified complication: Secondary | ICD-10-CM

## 2023-10-24 DIAGNOSIS — E66812 Obesity, class 2: Secondary | ICD-10-CM | POA: Insufficient documentation

## 2023-10-24 NOTE — Progress Notes (Signed)
 New Patient Office Visit  Subjective    Patient ID: Kari [name], female    DOB: 15-Jul-1960  Age: 63 y.o. MRN: [id number]  CC:  Chief Complaint  Patient presents with   Establish Care    Here to establish care.     Discussed the use of AI scribe software for clinical note transcription with the patient, who gave verbal consent to proceed.  History of Present Illness   Kari [name] is a 63 year old female who presents to establish care.  Hypertension- She has a history of hypertension, previously well-controlled, but she does not regularly monitor her blood pressure at home due to concerns about device accuracy. She takes hydrochlorothiazide  daily and occasionally uses Lasix  when experiencing fluid retention, which causes hand pain, but avoids it if feeling dehydrated. No significant leg swelling is noted.  Chest Pain- No chest pain as of today. She experiences occasional chest pain described as brief 'twinges' occurring infrequently, with the last episode over a week ago. She has a lengthy history of this and was previously referred to cardiology for further workup but never attended- she is agreeable to seeing cardiology in the [place] area. The pain is not localized and varies in location across the chest. She denies panic attacks but reports increased anxiety over the past year, particularly in interactions with her son, who also has anxiety.  Back Pain- She has a significant history of back problems, including a cervical fusion, thoracic-lumbar junction issues, and a herniated and calcified lumbar disc. She manages her back pain with Kratom taken upon waking and frequently uses a heating pad. Her back pain affects her ability to perform activities such as driving, pushing a cart, or doing dishes.   Long term NSAID use- on chronic meloxicam  for joint pain, prophylaxis via omeprazole . No symptoms of heartburn noted though GI issues raise  concern.  Chronic left flank pain and right flank pain-patient desires a GI consult.  Previously saw [place] [facility] [place], in the past she also saw Dr. Towana. Last colonoscopy performed by Dr. Towana 2015 per pt report. Record review indicates that patient was seen by [facility] from 12/2020- 02/2021. [facility] recommended endoscopy as imaging did not explain RUQ and right flank pain. MRI of abdomen showed 5 mm cystic lesion in pancreatic body- recommended follow up 1 year, benign 2.1 hepatic hemangioma, posterior right hepatic lobe. Endoscopy performed 02/26/2021- normal pathology per chart review. Pt has not followed up in office with GI since endoscopy performed and would like to request a different provider for further workup.  MDD- On Celexa  40 mg, states that she has been on this for greater than 20 years.  Patient is agreeable to recommendations for counseling and psychiatric care.  Discussed [facility] counseling and wellness in the area.  There are other unrelated non-urgent complaints, but due to the busy schedule and the amount of time I've already spent with her, time does not permit me to address these routine issues at today's visit. I've requested another appointment to review these additional issues.  Outpatient Encounter Medications as of 10/24/2023  Medication Sig   Acetylcysteine (NAC 600 PO) Take 600 mg by mouth daily.   ASHWAGANDHA PO Take 1,000 mg by mouth. 2 capsules once daily   B Complex-C (B-COMPLEX WITH VITAMIN C) tablet Take 1 tablet by mouth daily.   Cholecalciferol (VITAMIN D3) 25 MCG (1000 UT) CAPS Take 1,000 Units by mouth daily.   citalopram  (CELEXA ) 40  MG tablet Take 40 mg by mouth daily.   Coenzyme Q10 (CO Q 10) 100 MG CAPS Take 100 mg by mouth daily.   ezetimibe  (ZETIA ) 10 MG tablet Take 1 tablet (10 mg total) by mouth daily. Overdue for Annual appt w/labs must see provider for future refills   furosemide  (LASIX ) 20 MG tablet Take 1 tablet by mouth daily. (Patient  taking differently: Take 1 tablet by mouth as needed.)   gabapentin  (NEURONTIN ) 600 MG tablet Take 1.5 tablets by mouth daily.   hydrochlorothiazide  (HYDRODIURIL ) 25 MG tablet Take by mouth.   meloxicam  (MOBIC ) 15 MG tablet Take 1 tablet (15 mg total) by mouth daily.   metFORMIN  (GLUCOPHAGE ) 500 MG tablet Take 1,000 mg by mouth 2 (two) times daily with a meal.   Multiple Vitamin (MULTIVITAMIN) tablet Take 1 tablet by mouth daily.   Multiple Vitamins-Minerals (ZINC PO) Take by mouth. Daily in tea w/ ginger   Omeprazole  20 MG TBEC Take 20 mg by mouth daily.   ondansetron  (ZOFRAN ) 4 MG tablet Take 1 tablet (4 mg total) by mouth every 8 (eight) hours as needed for nausea or vomiting.   OVER THE COUNTER MEDICATION Take 3,000 mg by mouth 3 (three) times daily. Kratom 500 mg   OVER THE COUNTER MEDICATION CALM w/ magnesium   Probiotic Product (DIGESTIVE ADV DIGESTIVE/IMMUNE PO) Take 1 capsule by mouth daily.   [DISCONTINUED] cetirizine (ZYRTEC) 10 MG tablet Take 10 mg by mouth daily.   [DISCONTINUED] metFORMIN  (GLUCOPHAGE ) 1000 MG tablet TAKE 1 TABLET (1,000 MG TOTAL) BY MOUTH 2 (TWO) TIMES DAILY WITH A MEAL.   fluticasone (FLONASE) 50 MCG/ACT nasal spray Place 1 spray into both nostrils daily.   [DISCONTINUED] amoxicillin -clavulanate (AUGMENTIN ) 875-125 MG tablet Take 1 tablet by mouth every 12 (twelve) hours.   [DISCONTINUED] B Complex Vitamins (B COMPLEX PO) Take 1 tablet by mouth daily.   [DISCONTINUED] benzonatate  (TESSALON ) 100 MG capsule Take 1 capsule (100 mg total) by mouth every 8 (eight) hours.   [DISCONTINUED] celecoxib (CELEBREX) 100 MG capsule Take 100 mg by mouth 2 (two) times daily.   [DISCONTINUED] ketotifen (ZADITOR) 0.025 % ophthalmic solution Place 1 drop into both eyes as needed (itching).   [DISCONTINUED] montelukast  (SINGULAIR ) 10 MG tablet Take 1 tablet (10 mg total) by mouth at bedtime.   [DISCONTINUED] vitamin E 1000 UNIT capsule Take 1,000 Units by mouth daily.   No  facility-administered encounter medications on file as of 10/24/2023.    Past Medical History:  Diagnosis Date   Allergy    SEASONAL   Arthritis    Asthma    STATED RESOLVED   Carpal tunnel syndrome 01/12/2021   Chronic kidney disease    LESION,STONE   Chronic tension-type headache, intractable 01/06/2018   Last Assessment & Plan:  Formatting of this note might be different from the original. Concern over persistent sinus infection. 1 year history of persistent facial pressure and pain radiating down into the teeth and gums.  Associated fatigue.  No major nasal obstruction or colored rhinorrhea.  Much postnasal drainage in the throat sensation especially in the morning.  She is been treated with 4 an   Colitis    Encounter for cholecystectomy 04/24/2021   Fatigue 08/13/2013   Headache 08/31/2020   Hx of cervical spine surgery 04/24/2021   Hyperlipidemia associated with type 2 diabetes mellitus (HCC) 05/19/2020   Hypertension    Low back pain 08/13/2013   MDD (major depressive disorder), recurrent episode, moderate (HCC) 08/13/2013   OSA (  obstructive sleep apnea)    Osteoarthritis 08/13/2013   Ovarian cyst 04/24/2021   Pancreatic lesion 12/29/2020   Thoracic radiculopathy 04/24/2021   Type 2 diabetes mellitus with complication (HCC) 05/19/2020    Past Surgical History:  Procedure Laterality Date   CERVICAL FUSION     CESAREAN SECTION     x 3   CHOLECYSTECTOMY, LAPAROSCOPIC  2005    Family History  Problem Relation Age of Onset   Colon polyps Mother    Hypertension Mother    Hyperlipidemia Mother    Dementia Mother    Diabetes Mother    Hypertension Father    Heart attack Father    Kidney Stones Son    Autism spectrum disorder Son    Kidney Stones Son    Autism spectrum disorder Son    Autism spectrum disorder Son    Breast cancer Maternal Aunt    Leukemia Paternal Uncle    Colon cancer Neg Hx    Esophageal cancer Neg Hx    Rectal cancer Neg Hx    Stomach  cancer Neg Hx     Social History   Socioeconomic History   Marital status: Divorced    Spouse name: Not on file   Number of children: 3   Years of education: Not on file   Highest education level: Associate degree: academic program  Occupational History   Occupation: retired Charity fundraiser  Tobacco Use   Smoking status: Never    Passive exposure: Never   Smokeless tobacco: Never  Vaping Use   Vaping status: Never Used  Substance and Sexual Activity   Alcohol use: Not Currently   Drug use: Yes    Types: Marijuana    Comment: USE CBD GUMMIES BEARS occasionally   Sexual activity: Not Currently  Other Topics Concern   Not on file  Social History Narrative   ALL 3 SONS HAVE AUTISM    Lives with her first husband's motherReports 3 grown sons, oldest son's live in [place] worked as a travel nurseYoungest son lives in [place] with his dad- college studentWorks as a Engineer, drilling.Reports Associates degree in nursingDivorced   Social Drivers of Health   Financial Resource Strain: Not at Risk (02/27/2023)   Received from [employer]    How hard is it for you to pay for the very basics like food, housing, heating, medical care, and medications?: 1  Food Insecurity: No Food Insecurity (10/24/2023)   Hunger Vital Sign    Worried About Running Out of Food in the Last Year: Never true    Ran Out of Food in the Last Year: Never true  Transportation Needs: No Transportation Needs (10/24/2023)   PRAPARE - Administrator, Civil Service (Medical): No    Lack of Transportation (Non-Medical): No  Physical Activity: At Risk (06/05/2022)   Received from [facility]   Physical Activity    Physical Activity: 2  Stress: Not at Risk (02/27/2023)   [facility] from [facility]   Stress    Do you feel these kinds of stress these days?: 1  Social Connections: Not at Risk (02/27/2023)   [facility] from [facility]   Social Connections    How often do you see or talk to people that you care about  and feel close to? (For example: talking to friends on phone, visiting friends or family, going to church or club meetings): 1  Intimate Partner Violence: Not At Risk (10/24/2023)   Humiliation, Afraid, Rape, and Kick  questionnaire    Fear of Current or Ex-Partner: No    Emotionally Abused: No    Physically Abused: No    Sexually Abused: No    ROS  Per HPI      Objective    BP (!) 163/88   Pulse 64   Temp 98.4 F (36.9 C) (Oral)   Resp 16   Ht 4' 11.84 (1.52 m)   Wt 179 lb 4.8 oz (81.3 kg)   LMP 03/25/2008   SpO2 95%   BMI 35.20 kg/m   Physical Exam Constitutional:      General: She is not in acute distress.    Appearance: Normal appearance. She is not ill-appearing.  HENT:     Head: Normocephalic and atraumatic.     Right Ear: External ear normal.     Left Ear: External ear normal.     Nose: Nose normal.     Mouth/Throat:     Mouth: Mucous membranes are moist.     Pharynx: Oropharynx is clear.  Eyes:     General: No scleral icterus.    Extraocular Movements: Extraocular movements intact.     Conjunctiva/sclera: Conjunctivae normal.     Pupils: Pupils are equal, round, and reactive to light.  Neck:     Vascular: No carotid bruit.  Cardiovascular:     Rate and Rhythm: Normal rate and regular rhythm.     Pulses: Normal pulses.     Heart sounds: Normal heart sounds. No murmur heard.    No friction rub.  Pulmonary:     Effort: Pulmonary effort is normal. No respiratory distress.     Breath sounds: Normal breath sounds. No wheezing, rhonchi or rales.  Musculoskeletal:        General: Normal range of motion.     Cervical back: Neck supple.     Right lower leg: No edema.     Left lower leg: No edema.  Skin:    General: Skin is warm and dry.     Coloration: Skin is not jaundiced or pale.  Neurological:     General: No focal deficit present.     Mental Status: She is alert.     Deep Tendon Reflexes: Reflexes normal.  Psychiatric:        Mood and Affect:  Mood normal.        Behavior: Behavior normal.         Assessment & Plan:   Encounter to establish care  Housing instability -     AMB Referral VBCI Care Management  Type 2 diabetes mellitus with complication (HCC) Assessment & Plan: Chronic, stable on metformin .  Reassess with A1c today. - Continue current regimen of metformin . - Continue lifestyle modification.  Orders: -     Hemoglobin A1c -     B12 and Folate Panel  Hyperlipidemia associated with type 2 diabetes mellitus (HCC) Assessment & Plan: History of the same.  In the past did not want a statin but was agreeable to Zetia . - Reassess lipids today. - Continue current regimen and lifestyle modifications until lab results are reassessed.  Orders: -     Lipid panel  Primary hypertension Assessment & Plan: Chronic, not at goal. - Begin checking BP twice daily at home, and will plan to increase medical regimen at next visit if still uncontrolled. - Continue current regimen for now.  Orders: -     CBC with Differential/Platelet -     Comprehensive metabolic panel with GFR  OSA (obstructive  sleep apnea) Assessment & Plan: OSA on CPAP.  Continue to follow with pulmonology.  Orders: -     CBC with Differential/Platelet -     Comprehensive metabolic panel with GFR  MDD (major depressive disorder), recurrent episode, moderate (HCC) Assessment & Plan: Chronic, not at goal on current Celexa  dose.  Discussed counseling/med management in [place] at [facility].  Patient was provided with number to call. - Continue current regimen - Continue to monitor   Vitamin D  deficiency Assessment & Plan: History of the same, reassess today with vitamin D  lab.  Orders: -     VITAMIN D  25 Hydroxy (Vit-D Deficiency, Fractures)  Atypical chest pain Assessment & Plan: Long-term history of atypical occasional chest pain chest pain that was not present on exam today.  Patient denied EKG today.  Patient  would like further workup through cardiology in [place], will send in referral.  Will also reassess cholesterol today due to long-term history of hyperlipidemia not on statin.  Orders: -     Ambulatory referral to Cardiology  Long term (current) use of non-steroidal anti-inflammatories (nsaid) Assessment & Plan: On long-term meloxicam  15 mg.  Continue omeprazole  for GI protection.   Abdominal pain, unspecified abdominal location Assessment & Plan: Long-term history of the same.  Quite a bit of negative imaging in the past wheezing abdominal pain unexplained.  Has not seen a GI provider since 2022 and would like to reestablish with 1 in [place].  Will send in one today.  Patient also has a history of MRI of abdomen showing 5 mm pancreatic cyst and benign hepatic hemangioma on posterior right hepatic lobe.  Orders: -     Ambulatory referral to Gastroenterology  Morbid obesity [facility]) Assessment & Plan: BMI of 35.20 with both hypertension and type 2 diabetes indicates a diagnosis of morbid obesity.  She currently needs to work on lifestyle modification.    Return in about 6 weeks (around 12/05/2023) for htn.   Dustyn Armbrister T Preston Weill, PA-C

## 2023-10-24 NOTE — Patient Instructions (Addendum)
 It was nice to see you today!  Counseling/Psych Resource: [facility] [address]  [place], [place] [address]  Phone: [phone]  Fax: [phone]   If you have any problems before your next visit feel free to message me via MyChart (minor issues or questions) or call the office, otherwise you may reach out to schedule an office visit.  Thank you! Shamell Suarez, PA-C

## 2023-10-25 ENCOUNTER — Ambulatory Visit (HOSPITAL_BASED_OUTPATIENT_CLINIC_OR_DEPARTMENT_OTHER): Payer: Self-pay | Admitting: Student

## 2023-10-25 DIAGNOSIS — R109 Unspecified abdominal pain: Secondary | ICD-10-CM | POA: Insufficient documentation

## 2023-10-25 LAB — COMPREHENSIVE METABOLIC PANEL WITH GFR
ALT: 19 IU/L (ref 0–32)
AST: 19 IU/L (ref 0–40)
Albumin: 4.4 g/dL (ref 3.9–4.9)
Alkaline Phosphatase: 94 IU/L (ref 44–121)
BUN/Creatinine Ratio: 21 (ref 12–28)
BUN: 15 mg/dL (ref 8–27)
Bilirubin Total: 0.2 mg/dL (ref 0.0–1.2)
CO2: 22 mmol/L (ref 20–29)
Calcium: 9.6 mg/dL (ref 8.7–10.3)
Chloride: 100 mmol/L (ref 96–106)
Creatinine, Ser: 0.7 mg/dL (ref 0.57–1.00)
Globulin, Total: 2.6 g/dL (ref 1.5–4.5)
Glucose: 92 mg/dL (ref 70–99)
Potassium: 4.4 mmol/L (ref 3.5–5.2)
Sodium: 138 mmol/L (ref 134–144)
Total Protein: 7 g/dL (ref 6.0–8.5)
eGFR: 98 mL/min/1.73 (ref >59)

## 2023-10-25 LAB — CBC WITH DIFFERENTIAL/PLATELET
Basophils Absolute: 0 x10E3/uL (ref 0.0–0.2)
Basos: 1 %
EOS (ABSOLUTE): 0.2 x10E3/uL (ref 0.0–0.4)
Eos: 2 %
Hematocrit: 41.3 % (ref 34.0–46.6)
Hemoglobin: 13.8 g/dL (ref 11.1–15.9)
Immature Grans (Abs): 0 x10E3/uL (ref 0.0–0.1)
Immature Granulocytes: 0 %
Lymphocytes Absolute: 2.6 x10E3/uL (ref 0.7–3.1)
Lymphs: 31 %
MCH: 30.1 pg (ref 26.6–33.0)
MCHC: 33.4 g/dL (ref 31.5–35.7)
MCV: 90 fL (ref 79–97)
Monocytes Absolute: 0.7 x10E3/uL (ref 0.1–0.9)
Monocytes: 9 %
Neutrophils Absolute: 4.9 x10E3/uL (ref 1.4–7.0)
Neutrophils: 57 %
Platelets: 376 x10E3/uL (ref 150–450)
RBC: 4.58 x10E6/uL (ref 3.77–5.28)
RDW: 13 % (ref 11.7–15.4)
WBC: 8.5 x10E3/uL (ref 3.4–10.8)

## 2023-10-25 LAB — LIPID PANEL
Chol/HDL Ratio: 7.5 ratio — ABNORMAL HIGH (ref 0.0–4.4)
Cholesterol, Total: 286 mg/dL — ABNORMAL HIGH (ref 100–199)
HDL: 38 mg/dL — ABNORMAL LOW (ref >39)
LDL Chol Calc (NIH): 168 mg/dL — ABNORMAL HIGH (ref 0–99)
Triglycerides: 410 mg/dL — ABNORMAL HIGH (ref 0–149)
VLDL Cholesterol Cal: 80 mg/dL — ABNORMAL HIGH (ref 5–40)

## 2023-10-25 LAB — B12 AND FOLATE PANEL
Folate: >20 ng/mL (ref >3.0)
Vitamin B-12: 567 pg/mL (ref 232–1245)

## 2023-10-25 LAB — HEMOGLOBIN A1C
Est. average glucose Bld gHb Est-mCnc: 120 mg/dL
Hgb A1c MFr Bld: 5.8 % — ABNORMAL HIGH (ref 4.8–5.6)

## 2023-10-25 LAB — VITAMIN D 25 HYDROXY (VIT D DEFICIENCY, FRACTURES): Vit D, 25-Hydroxy: 35.7 ng/mL (ref 30.0–100.0)

## 2023-10-25 NOTE — Assessment & Plan Note (Signed)
 Chronic, stable on metformin .  Reassess with A1c today. - Continue current regimen of metformin . - Continue lifestyle modification.

## 2023-10-25 NOTE — Assessment & Plan Note (Signed)
 On long-term meloxicam  15 mg.  Continue omeprazole  for GI protection.

## 2023-10-25 NOTE — Assessment & Plan Note (Signed)
 History of the same.  In the past did not want a statin but was agreeable to Zetia . - Reassess lipids today. - Continue current regimen and lifestyle modifications until lab results are reassessed.

## 2023-10-25 NOTE — Assessment & Plan Note (Signed)
 BMI of 35.20 with both hypertension and type 2 diabetes indicates a diagnosis of morbid obesity.  She currently needs to work on lifestyle modification.

## 2023-10-25 NOTE — Assessment & Plan Note (Signed)
 OSA on CPAP.  Continue to follow with pulmonology.

## 2023-10-25 NOTE — Assessment & Plan Note (Signed)
 Long-term history of atypical occasional chest pain chest pain that was not present on exam today.  Patient denied EKG today.  Patient would like further workup through cardiology in [place], will send in referral.  Will also reassess cholesterol today due to long-term history of hyperlipidemia not on statin.

## 2023-10-25 NOTE — Assessment & Plan Note (Signed)
 Long-term history of the same.  Quite a bit of negative imaging in the past wheezing abdominal pain unexplained.  Has not seen a GI provider since 2022 and would like to reestablish with 1 in [place].  Will send in one today.  Patient also has a history of MRI of abdomen showing 5 mm pancreatic cyst and benign hepatic hemangioma on posterior right hepatic lobe.

## 2023-10-25 NOTE — Assessment & Plan Note (Signed)
 History of the same, reassess today with vitamin D  lab.

## 2023-10-25 NOTE — Assessment & Plan Note (Signed)
 Chronic, not at goal. - Begin checking BP twice daily at home, and will plan to increase medical regimen at next visit if still uncontrolled. - Continue current regimen for now.

## 2023-10-25 NOTE — Assessment & Plan Note (Signed)
 Chronic, not at goal on current Celexa  dose.  Discussed counseling/med management in [place] at [facility].  Patient was provided with number to call. - Continue current regimen - Continue to monitor

## 2023-10-27 ENCOUNTER — Telehealth: Payer: Self-pay | Admitting: *Deleted

## 2023-10-27 NOTE — Progress Notes (Unsigned)
 Complex Care Management Note Care Guide Note  10/27/2023 Name: Kari [name] MRN: [id number] DOB: 07/23/1960   Complex Care Management Outreach Attempts: An unsuccessful telephone outreach was attempted today to offer the patient information about available complex care management services.  Follow Up Plan:  Additional outreach attempts will be made to offer the patient complex care management information and services.   Encounter Outcome:  No Answer  Thedford Franks, CMA [place]  [facility], [facility] Guide Direct Dial: [phone]  Fax: [phone] Website: [email or web address]

## 2023-10-28 NOTE — Progress Notes (Signed)
 Complex Care Management Note  Care Guide Note 10/28/2023 Name: Kari [name] MRN: [id number] DOB: 08/19/1960  [name] [name] is a 63 y.o. year old female who sees Rothfuss, Lang DASEN, PA-C for primary care. I reached out to [name] [name] by phone today to offer complex care management services.  [name] was given information about Complex Care Management services today including:   The Complex Care Management services include support from the care team which includes your Nurse Care Manager, Clinical Social Worker, or Pharmacist.  The Complex Care Management team is here to help remove barriers to the health concerns and goals most important to you. Complex Care Management services are voluntary, and the patient may decline or stop services at any time by request to their care team member.   Complex Care Management Consent Status: Patient agreed to services and verbal consent obtained.   Follow up plan:  Telephone appointment with complex care management team member scheduled for:  10/31/2023  Encounter Outcome:  Patient Scheduled  Thedford Franks, CMA [place]  [facility], [facility] Guide Direct Dial: [phone]  Fax: [phone] Website: [email or web address]

## 2023-10-29 NOTE — Telephone Encounter (Signed)
 Copied from CRM [id number]. Topic: Clinical - Lab/Test Results >> Oct 27, 2023  3:49 PM Marissa P wrote: Reason for CRM: Patient is returning Benton, Carlie, CMA in regards to the results as well as the other cma, since it is the end of the day she is expecting a call by tomorrow

## 2023-10-30 ENCOUNTER — Other Ambulatory Visit (HOSPITAL_BASED_OUTPATIENT_CLINIC_OR_DEPARTMENT_OTHER): Payer: Self-pay | Admitting: Student

## 2023-10-30 DIAGNOSIS — E1169 Type 2 diabetes mellitus with other specified complication: Secondary | ICD-10-CM

## 2023-10-30 MED ORDER — BEMPEDOIC ACID 180 MG PO TABS: 1.0000 | ORAL_TABLET | Freq: Every day | ORAL | 3 refills | Status: DC

## 2023-10-31 ENCOUNTER — Other Ambulatory Visit (HOSPITAL_BASED_OUTPATIENT_CLINIC_OR_DEPARTMENT_OTHER): Payer: Self-pay | Admitting: Student

## 2023-10-31 ENCOUNTER — Encounter: Payer: Self-pay | Admitting: Licensed Clinical Social Worker

## 2023-10-31 ENCOUNTER — Telehealth (HOSPITAL_BASED_OUTPATIENT_CLINIC_OR_DEPARTMENT_OTHER): Payer: Self-pay

## 2023-10-31 ENCOUNTER — Telehealth: Payer: Self-pay | Admitting: Licensed Clinical Social Worker

## 2023-10-31 DIAGNOSIS — E1169 Type 2 diabetes mellitus with other specified complication: Secondary | ICD-10-CM

## 2023-10-31 NOTE — Telephone Encounter (Signed)
 PA submitted today for Nexletol  180MG  tablets on covermymeds.

## 2023-11-03 NOTE — Telephone Encounter (Signed)
 See response from covermymeds.

## 2023-11-04 ENCOUNTER — Telehealth (HOSPITAL_BASED_OUTPATIENT_CLINIC_OR_DEPARTMENT_OTHER): Payer: Self-pay

## 2023-11-04 NOTE — Telephone Encounter (Signed)
 LMTCB. Need to see if she has had an intolerance to statins or why she declines taking them. Insurance required a contraindication or intolerance to approve Nexletol .

## 2023-11-07 ENCOUNTER — Telehealth (HOSPITAL_BASED_OUTPATIENT_CLINIC_OR_DEPARTMENT_OTHER): Payer: Self-pay

## 2023-11-07 NOTE — Telephone Encounter (Signed)
 Spoke to pt. Said statins make her puke and have bad nightmares. Adding to allergy list. Atorvastatin made her puke and have nightmares. Please advise.

## 2023-11-10 ENCOUNTER — Telehealth: Payer: Self-pay

## 2023-11-10 NOTE — Progress Notes (Unsigned)
 Complex Care Management Note Care Guide Note  11/10/2023 Name: Kari [name] MRN: [id number] DOB: 1961-03-10   Complex Care Management Outreach Attempts: An unsuccessful telephone outreach was attempted today to offer the patient information about available complex care management services.  Follow Up Plan:  Additional outreach attempts will be made to offer the patient complex care management information and services.   Encounter Outcome:  No Answer  Dreama [facility]  [facility], [facility] VBCI Assistant Direct Dial: [phone]  Fax: [phone]

## 2023-11-12 NOTE — Progress Notes (Signed)
 Complex Care Management Note Care Guide Note  11/12/2023 Name: Kari [name] MRN: [id number] DOB: [birth year]   Complex Care Management Outreach Attempts: A second unsuccessful outreach was attempted today to offer the patient with information about available complex care management services.  Follow Up Plan:  No further outreach attempts will be made at this time. We have been unable to contact the patient to offer or enroll patient in complex care management services.  Encounter Outcome:  No Answer  Dreama [facility]  [facility], [facility] VBCI Assistant Direct Dial: [phone]  Fax: [phone]

## 2023-11-17 ENCOUNTER — Telehealth (HOSPITAL_BASED_OUTPATIENT_CLINIC_OR_DEPARTMENT_OTHER): Payer: Self-pay

## 2023-11-17 NOTE — Telephone Encounter (Signed)
 Pt advised that the nexletol  was approved.

## 2023-12-04 ENCOUNTER — Ambulatory Visit (INDEPENDENT_AMBULATORY_CARE_PROVIDER_SITE_OTHER): Admitting: Student

## 2023-12-04 ENCOUNTER — Encounter (HOSPITAL_BASED_OUTPATIENT_CLINIC_OR_DEPARTMENT_OTHER): Payer: Self-pay | Admitting: Student

## 2023-12-04 VITALS — BP 162/97 | HR 77 | Temp 98.9°F | Resp 16 | Ht 59.0 in | Wt 176.9 lb

## 2023-12-04 DIAGNOSIS — I10 Essential (primary) hypertension: Secondary | ICD-10-CM | POA: Diagnosis not present

## 2023-12-04 DIAGNOSIS — E785 Hyperlipidemia, unspecified: Secondary | ICD-10-CM

## 2023-12-04 DIAGNOSIS — E1169 Type 2 diabetes mellitus with other specified complication: Secondary | ICD-10-CM | POA: Diagnosis not present

## 2023-12-04 DIAGNOSIS — K869 Disease of pancreas, unspecified: Secondary | ICD-10-CM

## 2023-12-04 DIAGNOSIS — E118 Type 2 diabetes mellitus with unspecified complications: Secondary | ICD-10-CM | POA: Diagnosis not present

## 2023-12-04 DIAGNOSIS — M5442 Lumbago with sciatica, left side: Secondary | ICD-10-CM

## 2023-12-04 DIAGNOSIS — G8929 Other chronic pain: Secondary | ICD-10-CM

## 2023-12-04 DIAGNOSIS — K219 Gastro-esophageal reflux disease without esophagitis: Secondary | ICD-10-CM | POA: Diagnosis not present

## 2023-12-04 DIAGNOSIS — D259 Leiomyoma of uterus, unspecified: Secondary | ICD-10-CM

## 2023-12-04 MED ORDER — OMEPRAZOLE 40 MG PO CPDR: 40.0000 mg | DELAYED_RELEASE_CAPSULE | Freq: Every day | ORAL | 3 refills | Status: AC

## 2023-12-04 MED ORDER — HYDROCHLOROTHIAZIDE 25 MG PO TABS
25.0000 mg | ORAL_TABLET | Freq: Every day | ORAL | 3 refills | Status: AC
Start: 2023-12-04 — End: ?

## 2023-12-04 MED ORDER — EZETIMIBE 10 MG PO TABS: 10.0000 mg | ORAL_TABLET | Freq: Every day | ORAL | 0 refills | Status: DC

## 2023-12-04 MED ORDER — GABAPENTIN 600 MG PO TABS
900.0000 mg | ORAL_TABLET | Freq: Every day | ORAL | 3 refills | Status: AC
Start: 2023-12-04 — End: ?

## 2023-12-04 NOTE — Progress Notes (Signed)
 Established Patient Office Visit  Subjective   Patient ID: Kari [name], female    DOB: Dec 04, 1960  Age: 63 y.o. MRN: [id number]  Chief Complaint  Patient presents with   Medical Management of Chronic Issues    Follow up    Imaging    Has fatty liver, ovarian cysts, uterine fibroid, and lesion on pancreas, wanted to see if [facility] could order imaging.    HPI  Discussed the use of AI scribe software for clinical note transcription with the patient, who gave verbal consent to proceed.  History of Present Illness   Kari [name] is a 63 year old female who presents for follow-up on multiple chronic conditions.  She is concerned about her cholesterol levels and has been attempting lifestyle changes. She is interested in establishing a baseline for her cholesterol management and inquires about getting baseline liver enzymes and cholesterol levels checked. Her liver enzymes were previously checked and the results were within normal limits according to prior labs. She is currently not taking Zetia  and has not been on a statin for a long time.  She has a history of a cystic pancreatic lesion and a fibroid in her uterus. Imaging for these conditions has not been updated in a long time. She is curious about the pancreatic lesion and has not had a recent MRCP. She also has a history of ovarian cysts and occasionally experiences pelvic pain, which she attributes to the fibroid in her uterus.  She experiences chronic back pain, primarily in her lumbar spine, due to degenerative disc disease. She describes a history of a bulging disc and a fusion in her cervical spine. She experiences sciatica, mainly on the left side, and has radiculopathy between her cervical and thoracic spine. She takes gabapentin  900 mg at night for pain management and has previously had a nerve block that provided relief for a year and a half. She also takes meloxicam  for arthritis,  which she has been on for 25 years.  She reports occasional chest pain and has not yet attended a cardiology appointment. Her father died at 35 from a sudden heart attack, which raises her concern about her heart health. She has not been monitoring her blood pressure at home due to a lack of a blood pressure cuff but acknowledges that it has been high during previous visits. She believes that marijuana helps lower her blood pressure. She has not been taking bempedoic acid  for cholesterol due to concerns about side effects similar to those she experienced with statins- discussed.   Her A1c was recently checked and was 5.8. She is on metformin  for diabetes management.   She has a history of chronic kidney disease, but her recent kidney function tests were within normal limits. She has been on chronic NSAIDs for arthritis, which she acknowledges could impact her kidney health. She has not had a recent urine test to check kidney function.  She has reported a history of fatty liver, but her recent liver enzyme tests were within normal limits, and her 2022 imaging did not show evidence of hepatic steatosis- I believe her liver to be fine. She has a hepatic hemangioma, which has been stable.      Patient Active Problem List   Diagnosis Date Noted   Uterine leiomyoma 12/04/2023   Morbid obesity (HCC) 10/25/2023   Abdominal pain 10/25/2023   Vitamin D  deficiency 10/24/2023   Class 2 severe obesity due to excess calories with serious comorbidity and  body mass index (BMI) of 35.0 to 35.9 in adult [facility]) 10/24/2023   Long term (current) use of non-steroidal anti-inflammatories (nsaid) 10/24/2023   Allergy 04/27/2021   Arthritis 04/27/2021   Asthma 04/27/2021   Chronic kidney disease 04/27/2021   Colitis 04/27/2021   Hypertension 04/27/2021   Encounter for cholecystectomy 04/24/2021   Hx of cervical spine surgery 04/24/2021   Ovarian cyst 04/24/2021   Thoracic radiculopathy 04/24/2021   Carpal tunnel  syndrome 01/12/2021   Pancreatic lesion 12/29/2020   Headache 08/31/2020   Type 2 diabetes mellitus with complication (HCC) 05/19/2020   Hyperlipidemia associated with type 2 diabetes mellitus (HCC) 05/19/2020   Chronic tension-type headache, intractable 01/06/2018   OSA (obstructive sleep apnea) 08/31/2013   Fatigue 08/13/2013   Low back pain 08/13/2013   Osteoarthritis 08/13/2013   MDD (major depressive disorder), recurrent episode, moderate (HCC) 08/13/2013   Other chest pain 08/13/2013   Past Medical History:  Diagnosis Date   Allergy    SEASONAL   Arthritis    Asthma    STATED RESOLVED   Carpal tunnel syndrome 01/12/2021   Chronic kidney disease    LESION,STONE   Chronic tension-type headache, intractable 01/06/2018   Last Assessment & Plan:  Formatting of this note might be different from the original. Concern over persistent sinus infection. 1 year history of persistent facial pressure and pain radiating down into the teeth and gums.  Associated fatigue.  No major nasal obstruction or colored rhinorrhea.  Much postnasal drainage in the throat sensation especially in the morning.  She is been treated with 4 an   Colitis    Encounter for cholecystectomy 04/24/2021   Fatigue 08/13/2013   Headache 08/31/2020   Hx of cervical spine surgery 04/24/2021   Hyperlipidemia associated with type 2 diabetes mellitus (HCC) 05/19/2020   Hypertension    Low back pain 08/13/2013   MDD (major depressive disorder), recurrent episode, moderate (HCC) 08/13/2013   OSA (obstructive sleep apnea)    Osteoarthritis 08/13/2013   Ovarian cyst 04/24/2021   Pancreatic lesion 12/29/2020   Thoracic radiculopathy 04/24/2021   Type 2 diabetes mellitus with complication (HCC) 05/19/2020   Social History   Tobacco Use   Smoking status: Never    Passive exposure: Never   Smokeless tobacco: Never  Vaping Use   Vaping status: Never Used  Substance Use Topics   Alcohol use: Not Currently   Drug  use: Yes    Types: Marijuana    Comment: USE CBD GUMMIES BEARS occasionally   Allergies  Allergen Reactions   Iodinated Contrast Media Anaphylaxis and Hives   Macrodantin [Nitrofurantoin Macrocrystal]     makes her really ill   Morphine And Codeine Itching   Atorvastatin Other (See Comments)    Puking & nightmares   Macitentan    Other     CT  IV dye Flushed      ROS Per HPI.    Objective:     BP (!) 162/97   Pulse 77   Temp 98.9 F (37.2 C) (Oral)   Resp 16   Ht 4' 11 (1.499 m)   Wt 176 lb 14.4 oz (80.2 kg)   LMP 03/25/2008   SpO2 94%   BMI 35.73 kg/m  BP Readings from Last 3 Encounters:  12/04/23 (!) 162/97  10/24/23 (!) 163/88  03/09/23 104/70   Wt Readings from Last 3 Encounters:  12/04/23 176 lb 14.4 oz (80.2 kg)  10/24/23 179 lb 4.8 oz (81.3 kg)  08/12/22 174  lb 12.8 oz (79.3 kg)      Physical Exam Constitutional:      General: She is not in acute distress.    Appearance: Normal appearance. She is not ill-appearing.  HENT:     Head: Normocephalic and atraumatic.     Nose: Nose normal.  Eyes:     General: No scleral icterus.    Conjunctiva/sclera: Conjunctivae normal.  Cardiovascular:     Rate and Rhythm: Normal rate and regular rhythm.     Heart sounds: Normal heart sounds. No murmur heard.    No friction rub.  Pulmonary:     Effort: Pulmonary effort is normal. No respiratory distress.     Breath sounds: Normal breath sounds. No wheezing, rhonchi or rales.  Musculoskeletal:        General: Normal range of motion.  Skin:    General: Skin is warm and dry.     Coloration: Skin is not jaundiced or pale.  Neurological:     General: No focal deficit present.     Mental Status: She is alert.  Psychiatric:        Mood and Affect: Mood normal.        Behavior: Behavior normal.      No results found for any visits on 12/04/23.  Last CBC Lab Results  Component Value Date   WBC 8.5 10/24/2023   HGB 13.8 10/24/2023   HCT 41.3  10/24/2023   MCV 90 10/24/2023   MCH 30.1 10/24/2023   RDW 13.0 10/24/2023   PLT 376 10/24/2023   Last metabolic panel Lab Results  Component Value Date   GLUCOSE 92 10/24/2023   NA 138 10/24/2023   K 4.4 10/24/2023   CL 100 10/24/2023   CO2 22 10/24/2023   BUN 15 10/24/2023   CREATININE 0.70 10/24/2023   EGFR 98 10/24/2023   CALCIUM 9.6 10/24/2023   PROT 7.0 10/24/2023   ALBUMIN 4.4 10/24/2023   LABGLOB 2.6 10/24/2023   BILITOT 0.2 10/24/2023   ALKPHOS 94 10/24/2023   AST 19 10/24/2023   ALT 19 10/24/2023   Last lipids Lab Results  Component Value Date   CHOL 286 (H) 10/24/2023   HDL 38 (L) 10/24/2023   LDLCALC 168 (H) 10/24/2023   LDLDIRECT 176.0 05/17/2020   TRIG 410 (H) 10/24/2023   CHOLHDL 7.5 (H) 10/24/2023   Last hemoglobin A1c Lab Results  Component Value Date   HGBA1C 5.8 (H) 10/24/2023      The 10-year ASCVD risk score (Arnett DK, et al., 2019) is: 24.2%    Assessment & Plan:   Assessment and Plan    Hypertension Chronic, not at goal. Blood pressure elevated on two occasions, potentially influenced by pain, marijuana, and meloxicam  use. Kidney function normal. - Advise home blood pressure monitoring twice daily - Instruct to obtain a new blood pressure cuff - Schedule follow-up in six weeks to recheck blood pressure - Discuss meloxicam 's impact on blood pressure and kidney function - refill HCTZ  Chronic low back pain with lumbar radiculopathy and degenerative disc disease Chronic low back pain with lumbar radiculopathy affecting the left leg, diffuse pain possibly from multiple spinal regions. Gabapentin  and meloxicam  used for pain management. Considering another nerve block. - Continue gabapentin  900 mg nightly - Continue meloxicam  as prescribed - Encourage follow-up with pain management for potential nerve block  Cystic lesion of pancreas Cystic lesion on pancreas requires monitoring. No recent imaging or GI follow-up. - Order lipase  test - Referred to GI  for follow-up on pancreatic cystic lesion  Uterine fibroid and ovarian cysts Has a history of uterine fibroid and cysts. Concern about ovarian cysts due to age. - Order transvaginal ultrasound to evaluate uterine fibroid and ovarian cysts  Type 2 diabetes mellitus Chronic, stable. A1c well-controlled at 5.8%. - Continue metformin  as prescribed  Hyperlipidemia Chronic, not at goal. Not taking Zetia , concerned about bempedoic acid  side effects. Cholesterol reassessment in two months. Family history of heart disease.  - Prescribe Zetia , agrees to take - Reassess cholesterol levels in two months - Encourage follow-up with cardiology  General Health Maintenance Requires routine health maintenance screenings. - Discussed need for Pap smear   GERD Refill ppi     Return in about 6 weeks (around 01/15/2024) for Annual Physical, HTN.    Zedekiah Hinderman T Amirah Goerke, PA-C

## 2023-12-04 NOTE — Patient Instructions (Signed)
 It was nice to see you today!  If you have any problems before your next visit feel free to message me via MyChart (minor issues or questions) or call the office, otherwise you may reach out to schedule an office visit.  Thank you! Pau Banh, PA-C

## 2023-12-05 LAB — MICROALBUMIN / CREATININE URINE RATIO
Creatinine, Urine: 131.1 mg/dL
Microalb/Creat Ratio: 13 mg/g{creat} (ref 0–29)
Microalbumin, Urine: 16.7 ug/mL

## 2023-12-05 LAB — LIPASE: Lipase: 35 U/L (ref 14–72)

## 2023-12-08 ENCOUNTER — Ambulatory Visit (HOSPITAL_BASED_OUTPATIENT_CLINIC_OR_DEPARTMENT_OTHER): Payer: Self-pay | Admitting: Student

## 2023-12-15 ENCOUNTER — Inpatient Hospital Stay (HOSPITAL_BASED_OUTPATIENT_CLINIC_OR_DEPARTMENT_OTHER): Admission: RE | Admit: 2023-12-15 | Source: Ambulatory Visit | Admitting: Radiology

## 2023-12-16 ENCOUNTER — Encounter (HOSPITAL_BASED_OUTPATIENT_CLINIC_OR_DEPARTMENT_OTHER): Payer: Self-pay

## 2024-01-15 ENCOUNTER — Inpatient Hospital Stay (HOSPITAL_BASED_OUTPATIENT_CLINIC_OR_DEPARTMENT_OTHER): Admission: RE | Admit: 2024-01-15 | Discharge: 2024-01-15 | Attending: Student | Admitting: Radiology

## 2024-01-15 ENCOUNTER — Encounter (HOSPITAL_BASED_OUTPATIENT_CLINIC_OR_DEPARTMENT_OTHER): Payer: Self-pay | Admitting: Student

## 2024-01-15 ENCOUNTER — Ambulatory Visit (INDEPENDENT_AMBULATORY_CARE_PROVIDER_SITE_OTHER): Admitting: Student

## 2024-01-15 VITALS — BP 132/80 | HR 73 | Temp 98.4°F | Resp 16 | Ht 59.0 in | Wt 182.0 lb

## 2024-01-15 DIAGNOSIS — E1169 Type 2 diabetes mellitus with other specified complication: Secondary | ICD-10-CM

## 2024-01-15 DIAGNOSIS — D259 Leiomyoma of uterus, unspecified: Secondary | ICD-10-CM | POA: Diagnosis not present

## 2024-01-15 DIAGNOSIS — Z791 Long term (current) use of non-steroidal anti-inflammatories (NSAID): Secondary | ICD-10-CM

## 2024-01-15 DIAGNOSIS — Z1231 Encounter for screening mammogram for malignant neoplasm of breast: Secondary | ICD-10-CM | POA: Diagnosis not present

## 2024-01-15 DIAGNOSIS — K869 Disease of pancreas, unspecified: Secondary | ICD-10-CM

## 2024-01-15 DIAGNOSIS — E118 Type 2 diabetes mellitus with unspecified complications: Secondary | ICD-10-CM

## 2024-01-15 DIAGNOSIS — Z0000 Encounter for general adult medical examination without abnormal findings: Secondary | ICD-10-CM

## 2024-01-15 DIAGNOSIS — I10 Essential (primary) hypertension: Secondary | ICD-10-CM

## 2024-01-15 DIAGNOSIS — E785 Hyperlipidemia, unspecified: Secondary | ICD-10-CM

## 2024-01-15 DIAGNOSIS — Z23 Encounter for immunization: Secondary | ICD-10-CM

## 2024-01-15 DIAGNOSIS — F331 Major depressive disorder, recurrent, moderate: Secondary | ICD-10-CM

## 2024-01-15 MED ORDER — EZETIMIBE 10 MG PO TABS: 10.0000 mg | ORAL_TABLET | Freq: Every day | ORAL | 6 refills | Status: AC

## 2024-01-15 NOTE — Patient Instructions (Signed)
 It was nice to see you today!  If you have any problems before your next visit feel free to message me via MyChart (minor issues or questions) or call the office, otherwise you may reach out to schedule an office visit.  Thank you! Pau Banh, PA-C

## 2024-01-15 NOTE — Progress Notes (Signed)
 Complete physical exam  Patient: Kari [name]   DOB: 09/24/1960   63 y.o. Female  MRN: [id number]  Subjective:    Chief Complaint  Patient presents with   Annual Exam    Annual exam. Has cologuard ready to do at home anytime she can now. Declining pap today. Due for mammogram.     Discussed the use of AI scribe software for clinical note transcription with the patient, who gave verbal consent to proceed.  History of Present Illness   Kari [name] is a 63 year old female who presents for an annual physical exam.  She has a history of hypertension and diabetes, with her blood pressure typically around 130/70 mmHg at home. She takes hydrochlorothiazide  daily and Lasix  twice a week to maintain her blood pressure below 130/80 mmHg. She has not had a recent eye exam for diabetes, though she had one last year. She has not received the pneumonia or shingles vaccines, but she received a flu shot today.  She has a pancreatic lesion, an ovarian cyst, and a uterine fibroid, all of which are being monitored. She manages her conditions with a healthy diet, focusing on high fiber intake. She reports overeating but maintains a diet rich in vegetables and fiber. She exercises regularly, walking daily and aiming for 10,000 steps.  She experiences chronic shoulder pain and has a history of past injuries and arthritis. She has a history of arthritis and has undergone cervical fusion. She manages her pain with Kratom and has not pursued further pain management due to the hassle involved. She has a history of carpal tunnel syndrome and uses braces as needed, but has not had surgery for this condition due to past caregiving responsibilities.  She has been prescribed Zetia  for cholesterol management but has not filled the prescription.  She has denied bempedoic acid  in the past.  She has been on Celexa  for many years, with a recent increase to 40 mg, which has significantly  improved her mood.  Her father had ankylosing spondylitis, but she has had negative inflammatory markers in the past, including ANA, rheumatoid factor, CRP, and sed rate.      Most recent fall risk assessment:     No data to display           Most recent depression screenings:    01/15/2024    9:54 AM 12/04/2023    8:53 AM  PHQ 2/9 Scores  PHQ - 2 Score 0 4  PHQ- 9 Score 8 9    Patient Active Problem List   Diagnosis Date Noted   Uterine leiomyoma 12/04/2023   Morbid obesity (HCC) 10/25/2023   Abdominal pain 10/25/2023   Vitamin D  deficiency 10/24/2023   Class 2 severe obesity due to excess calories with serious comorbidity and body mass index (BMI) of 35.0 to 35.9 in adult 10/24/2023   Long term (current) use of non-steroidal anti-inflammatories (nsaid) 10/24/2023   Allergy 04/27/2021   Arthritis 04/27/2021   Asthma 04/27/2021   Chronic kidney disease 04/27/2021   Colitis 04/27/2021   Hypertension 04/27/2021   Encounter for cholecystectomy 04/24/2021   Hx of cervical spine surgery 04/24/2021   Ovarian cyst 04/24/2021   Thoracic radiculopathy 04/24/2021   Carpal tunnel syndrome 01/12/2021   Pancreatic lesion 12/29/2020   Headache 08/31/2020   Type 2 diabetes mellitus with complication (HCC) 05/19/2020   Hyperlipidemia associated with type 2 diabetes mellitus (HCC) 05/19/2020   Chronic tension-type headache, intractable 01/06/2018  OSA (obstructive sleep apnea) 08/31/2013   Fatigue 08/13/2013   Low back pain 08/13/2013   Osteoarthritis 08/13/2013   MDD (major depressive disorder), recurrent episode, moderate (HCC) 08/13/2013   Other chest pain 08/13/2013   Past Medical History:  Diagnosis Date   Allergy    SEASONAL   Arthritis    Asthma    STATED RESOLVED   Carpal tunnel syndrome 01/12/2021   Chronic kidney disease    LESION,STONE   Chronic tension-type headache, intractable 01/06/2018   Last Assessment & Plan:  Formatting of this note might be  different from the original. Concern over persistent sinus infection. 1 year history of persistent facial pressure and pain radiating down into the teeth and gums.  Associated fatigue.  No major nasal obstruction or colored rhinorrhea.  Much postnasal drainage in the throat sensation especially in the morning.  She is been treated with 4 an   Colitis    Encounter for cholecystectomy 04/24/2021   Fatigue 08/13/2013   Headache 08/31/2020   Hx of cervical spine surgery 04/24/2021   Hyperlipidemia associated with type 2 diabetes mellitus (HCC) 05/19/2020   Hypertension    Low back pain 08/13/2013   MDD (major depressive disorder), recurrent episode, moderate (HCC) 08/13/2013   OSA (obstructive sleep apnea)    Osteoarthritis 08/13/2013   Ovarian cyst 04/24/2021   Pancreatic lesion 12/29/2020   Thoracic radiculopathy 04/24/2021   Type 2 diabetes mellitus with complication (HCC) 05/19/2020   Social History   Tobacco Use   Smoking status: Never    Passive exposure: Never   Smokeless tobacco: Never  Vaping Use   Vaping status: Never Used  Substance Use Topics   Alcohol use: Not Currently   Drug use: Yes    Types: Marijuana    Comment: USES CBD GUMMIES BEARS & smokes occasionally   Allergies  Allergen Reactions   Iodinated Contrast Media Anaphylaxis and Hives   Macrodantin [Nitrofurantoin Macrocrystal]     makes her really ill   Morphine And Codeine Itching   Atorvastatin Other (See Comments)    Puking & nightmares   Macitentan    Other     CT  IV dye Flushed      Patient Care Team: Lyndsie Wallman T, PA-C as PCP - General (Physician Assistant)   Outpatient Medications Prior to Visit  Medication Sig   Acetylcysteine (NAC 600 PO) Take 600 mg by mouth daily.   ASHWAGANDHA PO Take by mouth daily.   B Complex-C (B-COMPLEX WITH VITAMIN C) tablet Take 1 tablet by mouth daily.   Cholecalciferol (VITAMIN D3) 25 MCG (1000 UT) CAPS Take 1,000 Units by mouth daily.   citalopram   (CELEXA ) 40 MG tablet Take 40 mg by mouth daily.   Coenzyme Q10 (CO Q 10) 100 MG CAPS Take 100 mg by mouth daily.   furosemide  (LASIX ) 20 MG tablet Take 1 tablet by mouth daily. (Patient taking differently: Take 1 tablet by mouth as needed.)   gabapentin  (NEURONTIN ) 600 MG tablet Take 1.5 tablets (900 mg total) by mouth at bedtime.   hydrochlorothiazide  (HYDRODIURIL ) 25 MG tablet Take 1 tablet (25 mg total) by mouth daily.   MAGNESIUM COMPLEX PO Take by mouth daily.   meloxicam  (MOBIC ) 15 MG tablet Take 1 tablet (15 mg total) by mouth daily.   metFORMIN  (GLUCOPHAGE ) 500 MG tablet Take 1,000 mg by mouth 2 (two) times daily with a meal.   Multiple Vitamin (MULTIVITAMIN) tablet Take 1 tablet by mouth daily.   Multiple Vitamins-Minerals (ZINC  PO) Take by mouth. Daily in tea w/ ginger   omeprazole  (PRILOSEC) 40 MG capsule Take 1 capsule (40 mg total) by mouth daily.   OVER THE COUNTER MEDICATION Take 3,000 mg by mouth 3 (three) times daily. Kratom 500 mg   Probiotic Product (DIGESTIVE ADV DIGESTIVE/IMMUNE PO) Take 1 capsule by mouth daily.   [DISCONTINUED] ezetimibe  (ZETIA ) 10 MG tablet Take 1 tablet (10 mg total) by mouth daily. Overdue for Annual appt w/labs must see provider for future refills   [DISCONTINUED] NEXLETOL  180 MG TABS TAKE 1 TABLET BY MOUTH EVERY DAY   [DISCONTINUED] ondansetron  (ZOFRAN ) 4 MG tablet Take 1 tablet (4 mg total) by mouth every 8 (eight) hours as needed for nausea or vomiting.   [DISCONTINUED] OVER THE COUNTER MEDICATION CALM w/ magnesium   No facility-administered medications prior to visit.    ROS  Per HPI     Objective:     BP 132/80   Pulse 73   Temp 98.4 F (36.9 C) (Oral)   Resp 16   Ht 4' 11 (1.499 m)   Wt 182 lb (82.6 kg)   LMP 03/25/2008   SpO2 97%   BMI 36.76 kg/m  BP Readings from Last 3 Encounters:  01/15/24 132/80  12/04/23 (!) 162/97  10/24/23 (!) 163/88   Wt Readings from Last 3 Encounters:  01/15/24 182 lb (82.6 kg)  12/04/23 176  lb 14.4 oz (80.2 kg)  10/24/23 179 lb 4.8 oz (81.3 kg)      Physical Exam Constitutional:      General: She is not in acute distress.    Appearance: Normal appearance. She is not ill-appearing or diaphoretic.  HENT:     Head: Normocephalic and atraumatic.     Right Ear: Tympanic membrane, ear canal and external ear normal.     Left Ear: Tympanic membrane, ear canal and external ear normal.     Nose: Nose normal.     Mouth/Throat:     Mouth: Mucous membranes are moist.     Pharynx: Oropharynx is clear.  Eyes:     General: No scleral icterus.       Right eye: No discharge.        Left eye: No discharge.     Extraocular Movements: Extraocular movements intact.     Conjunctiva/sclera: Conjunctivae normal.     Pupils: Pupils are equal, round, and reactive to light.  Neck:     Thyroid : No thyroid  mass, thyromegaly or thyroid  tenderness.     Vascular: No carotid bruit.  Cardiovascular:     Rate and Rhythm: Normal rate and regular rhythm.     Pulses: Normal pulses.     Heart sounds: Normal heart sounds. No murmur heard.    No friction rub. No gallop.  Pulmonary:     Effort: Pulmonary effort is normal.     Breath sounds: Normal breath sounds. No wheezing, rhonchi or rales.  Chest:     Chest wall: No tenderness.  Abdominal:     General: Bowel sounds are normal. There is no distension.     Palpations: Abdomen is soft.     Tenderness: There is no abdominal tenderness. There is no guarding.  Musculoskeletal:        General: No swelling, deformity or signs of injury.     Cervical back: Neck supple.     Right lower leg: No edema.     Left lower leg: No edema.     Right foot: No deformity or Charcot  foot.     Left foot: No deformity or Charcot foot.  Feet:     Right foot:     Protective Sensation: 9 sites tested.  9 sites sensed.     Skin integrity: Skin integrity normal. No ulcer, skin breakdown, erythema or warmth.     Toenail Condition: Right toenails are normal.     Left  foot:     Protective Sensation: 9 sites tested.  9 sites sensed.     Skin integrity: Skin integrity normal. No ulcer, skin breakdown, erythema or warmth.     Toenail Condition: Left toenails are normal.     Comments: Normal pulses Lymphadenopathy:     Cervical: No cervical adenopathy.     Right cervical: No superficial or posterior cervical adenopathy.    Left cervical: No superficial cervical adenopathy.  Skin:    Coloration: Skin is not jaundiced.     Findings: No rash.  Neurological:     General: No focal deficit present.     Mental Status: She is alert and oriented to person, place, and time.     Motor: No weakness.  Psychiatric:        Behavior: Behavior normal.      Results for orders placed or performed in visit on 01/15/24  [facility] HIV SCREENING LAB  Result Value Ref Range   [facility] HIV Screening Negative - Validated   [facility] HEPATITIS C SCREENING LAB  Result Value Ref Range   [facility] Hepatitis Screen Negative-Validated    Last CBC Lab Results  Component Value Date   WBC 8.5 10/24/2023   HGB 13.8 10/24/2023   HCT 41.3 10/24/2023   MCV 90 10/24/2023   MCH 30.1 10/24/2023   RDW 13.0 10/24/2023   PLT 376 10/24/2023   Last metabolic panel Lab Results  Component Value Date   GLUCOSE 92 10/24/2023   NA 138 10/24/2023   K 4.4 10/24/2023   CL 100 10/24/2023   CO2 22 10/24/2023   BUN 15 10/24/2023   CREATININE 0.70 10/24/2023   EGFR 98 10/24/2023   CALCIUM 9.6 10/24/2023   PROT 7.0 10/24/2023   ALBUMIN 4.4 10/24/2023   LABGLOB 2.6 10/24/2023   BILITOT 0.2 10/24/2023   ALKPHOS 94 10/24/2023   AST 19 10/24/2023   ALT 19 10/24/2023   Last lipids Lab Results  Component Value Date   CHOL 286 (H) 10/24/2023   HDL 38 (L) 10/24/2023   LDLCALC 168 (H) 10/24/2023   LDLDIRECT 176.0 05/17/2020   TRIG 410 (H) 10/24/2023   CHOLHDL 7.5 (H) 10/24/2023   Last hemoglobin A1c Lab Results  Component Value Date   HGBA1C 5.8 (H) 10/24/2023   Last thyroid  functions Lab Results   Component Value Date   TSH 1.226 08/10/2013   Last vitamin D  Lab Results  Component Value Date   VD25OH 35.7 10/24/2023   Last vitamin B12 and Folate Lab Results  Component Value Date   VITAMINB12 567 10/24/2023   FOLATE >20.0 10/24/2023        Assessment & Plan:    Routine Health Maintenance and Physical Exam  Health Maintenance  Topic Date Due   Pneumococcal Vaccine for age over 30 (1 of 2 - PCV) Never done   Pap with HPV screening  Never done   Breast Cancer Screening  Never done   Medicare Annual Wellness Visit  06/28/2020   Eye exam for diabetics  12/21/2021   Colon Cancer Screening  11/23/2023   Zoster (Shingles) Vaccine (1 of 2)  04/16/2024*   COVID-19 Vaccine (3 - Pfizer risk series) 12/03/2024*   DTaP/Tdap/Td vaccine (2 - Td or Tdap) 01/14/2025*   Hemoglobin A1C  04/25/2024   Yearly kidney function blood test for diabetes  10/23/2024   Yearly kidney health urinalysis for diabetes  12/03/2024   Complete foot exam   01/14/2025   Flu Shot  Completed   Hepatitis C Screening  Completed   HIV Screening  Completed   Hepatitis B Vaccine  Aged Out   HPV Vaccine  Aged Out   Meningitis B Vaccine  Aged Out  *Topic was postponed. The date shown is not the original due date.    Encouraged her to engage in regular exercise appropriate for her age and condition.  Assessment and Plan    Adult Wellness Visit/ BMI 36 Morbid obesity w/ comorbidities of T2DM, hypertension, hyperlipidemia, and OA Annual wellness visit conducted. Blood pressure improved compared to last visit. Importance of maintaining blood pressure within target range discussed, especially given diabetes diagnosis. Regular foot and eye exams emphasized for diabetes management. - Schedule regular foot exams - Schedule regular eye exams for diabetes monitoring - Continue to work on diet and exercise  Type 2 diabetes mellitus Chronic, stable. Type 2 diabetes mellitus requires regular monitoring and  management. Emphasized maintaining blood pressure within target range due to diabetes. Encouraged dietary management and regular exercise. Regular eye and foot exams discussed as part of diabetes management. - Continue current diabetes management plan - Schedule regular eye exams - Schedule regular foot exams  Hypertension Chronic, not at goal. Hypertension managed with hydrochlorothiazide  and occasional Lasix . Home blood pressure readings around 130/70 mmHg. Discussed potential addition of losartan for further control. Impact of meloxicam  on blood pressure and potential need for treatment adjustment if meloxicam  cannot be discontinued discussed. - Continue hydrochlorothiazide  25 mg daily - Consider adding losartan if further blood pressure control is needed  Hyperlipidemia Chronic, not at goal. Hyperlipidemia management includes starting Zetia , not yet filled. Discussed potential use of bempedoic acid  (Nexletol ) as alternative. Encouraged lifestyle modifications including increased physical activity and dietary changes. - Fill Zetia  - Consider bempedoic acid  (Nexletol ) if needed - Encourage lifestyle modifications including increased physical activity and dietary changes  Depression Chronic, stable. Depression managed with Celexa , effective at 40 mg with significant improvement. - Continue Celexa  40 mg daily  Osteoarthritis Osteoarthritis with shoulder and joint pain. Discussed possibility of post-traumatic arthritis due to previous injuries. Encouraged physical activity and potential benefits of physical medicine and rehabilitation. Option of steroid injections for joint pain management discussed. - Encourage physical activity - Consider referral to physical medicine and rehabilitation - Consider steroid injections for joint pain management  Pancreatic lesion Pancreatic lesion requires monitoring by GI specialists. Importance of follow-up with GI to monitor lesion and associated symptoms  discussed. - Follow up with GI specialists for monitoring of pancreatic lesion  General Health Maintenance Discussed importance of regular health screenings and vaccinations. Encouraged completion of Cologuard for colon cancer screening. Need for mammogram and Pap smear discussed. Potential benefits and side effects of shingles vaccine discussed. Importance of regular dental visits for oral health maintenance emphasized. - Complete Cologuard for colon cancer  screening - Schedule mammogram - Schedule Pap smear - Consider shingles vaccine - Schedule regular dental visits  Follow-up Follow-up plans discussed including scheduling next annual physical and A1c monitoring. Option to schedule follow-up before or after provider's leave in January discussed. - Schedule next annual physical - Schedule A1c monitoring - Consider scheduling follow-up  before or after January leave      Return in about 1 year (around 01/14/2025), or if symptoms worsen or fail to improve, for Annual Physical.     Lang DASEN Magalene Mclear, PA-C

## 2024-02-10 ENCOUNTER — Encounter (HOSPITAL_BASED_OUTPATIENT_CLINIC_OR_DEPARTMENT_OTHER): Payer: Self-pay

## 2024-02-18 ENCOUNTER — Ambulatory Visit (HOSPITAL_BASED_OUTPATIENT_CLINIC_OR_DEPARTMENT_OTHER)

## 2024-03-15 NOTE — Progress Notes (Signed)
 Kari [name]                                          MRN: [id number]   03/15/2024   The [facility] Quality Team Specialist reviewed this patient medical record for the purposes of chart review for care gap closure. The following were reviewed: chart review for care gap closure-breast cancer screening and colorectal cancer screening.    [facility] Quality Team

## 2024-03-26 ENCOUNTER — Ambulatory Visit (HOSPITAL_BASED_OUTPATIENT_CLINIC_OR_DEPARTMENT_OTHER): Admitting: Student

## 2024-04-26 ENCOUNTER — Other Ambulatory Visit (HOSPITAL_BASED_OUTPATIENT_CLINIC_OR_DEPARTMENT_OTHER): Payer: Self-pay | Admitting: Student
